# Patient Record
Sex: Male | Born: 1990 | Race: Black or African American | Hispanic: No | Marital: Married | Smoking: Never smoker
Health system: Southern US, Community
[De-identification: ages and names within clinical notes are randomized; demographics above are authoritative.]

## PROBLEM LIST (undated history)

## (undated) HISTORY — PX: NO PAST SURGERIES: SHX2092

---

## 2020-03-31 DIAGNOSIS — I2699 Other pulmonary embolism without acute cor pulmonale: Secondary | ICD-10-CM

## 2020-03-31 HISTORY — DX: Other pulmonary embolism without acute cor pulmonale: I26.99

## 2020-04-12 ENCOUNTER — Inpatient Hospital Stay
Admission: EM | Admit: 2020-04-12 | Discharge: 2020-04-16 | DRG: 177 | Disposition: A | Discharge status: Home / Self Care | Payer: HRSA Program | Attending: Family Medicine | Admitting: Family Medicine

## 2020-04-12 ENCOUNTER — Encounter: Payer: Self-pay | Admitting: *Deleted

## 2020-04-12 ENCOUNTER — Other Ambulatory Visit: Payer: Self-pay

## 2020-04-12 ENCOUNTER — Emergency Department: Payer: HRSA Program

## 2020-04-12 DIAGNOSIS — R0902 Hypoxemia: Secondary | ICD-10-CM

## 2020-04-12 DIAGNOSIS — R0602 Shortness of breath: Secondary | ICD-10-CM | POA: Diagnosis present

## 2020-04-12 DIAGNOSIS — J810 Acute pulmonary edema: Secondary | ICD-10-CM | POA: Diagnosis present

## 2020-04-12 DIAGNOSIS — T380X5A Adverse effect of glucocorticoids and synthetic analogues, initial encounter: Secondary | ICD-10-CM | POA: Diagnosis not present

## 2020-04-12 DIAGNOSIS — U071 COVID-19: Principal | ICD-10-CM | POA: Diagnosis present

## 2020-04-12 DIAGNOSIS — I2699 Other pulmonary embolism without acute cor pulmonale: Secondary | ICD-10-CM | POA: Diagnosis present

## 2020-04-12 DIAGNOSIS — F17210 Nicotine dependence, cigarettes, uncomplicated: Secondary | ICD-10-CM | POA: Diagnosis present

## 2020-04-12 DIAGNOSIS — J1282 Pneumonia due to coronavirus disease 2019: Secondary | ICD-10-CM | POA: Diagnosis present

## 2020-04-12 DIAGNOSIS — R739 Hyperglycemia, unspecified: Secondary | ICD-10-CM | POA: Diagnosis not present

## 2020-04-12 DIAGNOSIS — M79606 Pain in leg, unspecified: Secondary | ICD-10-CM

## 2020-04-12 DIAGNOSIS — J9601 Acute respiratory failure with hypoxia: Secondary | ICD-10-CM | POA: Diagnosis present

## 2020-04-12 LAB — BASIC METABOLIC PANEL
Anion gap: 13 (ref 5–15)
BUN: 18 mg/dL (ref 6–20)
CO2: 20 mmol/L — ABNORMAL LOW (ref 22–32)
Calcium: 8.8 mg/dL — ABNORMAL LOW (ref 8.9–10.3)
Chloride: 103 mmol/L (ref 98–111)
Creatinine, Ser: 1.11 mg/dL (ref 0.61–1.24)
GFR, Estimated: >60 mL/min (ref >60)
Glucose, Bld: 128 mg/dL — ABNORMAL HIGH (ref 70–99)
Potassium: 3.7 mmol/L (ref 3.5–5.1)
Sodium: 136 mmol/L (ref 135–145)

## 2020-04-12 LAB — TROPONIN I (HIGH SENSITIVITY): Troponin I (High Sensitivity): 8 ng/L (ref <18)

## 2020-04-12 LAB — CBC
HCT: 42.8 % (ref 39.0–52.0)
Hemoglobin: 14.7 g/dL (ref 13.0–17.0)
MCH: 28.1 pg (ref 26.0–34.0)
MCHC: 34.3 g/dL (ref 30.0–36.0)
MCV: 81.8 fL (ref 80.0–100.0)
Platelets: 522 10*3/uL — ABNORMAL HIGH (ref 150–400)
RBC: 5.23 MIL/uL (ref 4.22–5.81)
RDW: 12.6 % (ref 11.5–15.5)
WBC: 10.7 10*3/uL — ABNORMAL HIGH (ref 4.0–10.5)
nRBC: 0 % (ref 0.0–0.2)

## 2020-04-12 MED ORDER — HEPARIN (PORCINE) 25000 UT/250ML-% IV SOLN
1550.0000 [IU]/h | INTRAVENOUS | Status: DC
  Administered 2020-04-12: 1550 [IU]/h via INTRAVENOUS
  Filled 2020-04-12: qty 250

## 2020-04-12 MED ORDER — DEXAMETHASONE SODIUM PHOSPHATE 10 MG/ML IJ SOLN
10.0000 mg | Freq: Once | INTRAMUSCULAR | Status: AC
  Administered 2020-04-12: 23:00:00 10 mg via INTRAVENOUS
  Filled 2020-04-12: qty 1

## 2020-04-12 MED ORDER — IOHEXOL 350 MG/ML SOLN
75.0000 mL | Freq: Once | INTRAVENOUS | Status: AC | PRN
  Administered 2020-04-12: 23:00:00 75 mL via INTRAVENOUS

## 2020-04-12 MED ORDER — HEPARIN BOLUS VIA INFUSION
5000.0000 [IU] | Freq: Once | INTRAVENOUS | Status: AC
  Administered 2020-04-12: 5000 [IU] via INTRAVENOUS
  Filled 2020-04-12: qty 5000

## 2020-04-12 MED ORDER — SODIUM CHLORIDE 0.9 % IV SOLN
200.0000 mg | Freq: Once | INTRAVENOUS | Status: AC
  Administered 2020-04-13: 01:00:00 200 mg via INTRAVENOUS
  Filled 2020-04-12: qty 200

## 2020-04-12 MED ORDER — SODIUM CHLORIDE 0.9 % IV SOLN
100.0000 mg | Freq: Every day | INTRAVENOUS | Status: AC
  Administered 2020-04-13 – 2020-04-16 (×4): 100 mg via INTRAVENOUS
  Filled 2020-04-12 (×5): qty 20

## 2020-04-12 NOTE — Progress Notes (Signed)
Remdesivir - Pharmacy Brief Note   O:  CXR: "Mild patchy density at the lung bases, which may reflect atelectasis" or atypical pneumonia in the appropriate setting. SpO2: 89-97% on 2 L/min [place]   A/P:  Remdesivir 200 mg IVPB once followed by 100 mg IVPB daily x 4 days.   Otelia Sergeant, PharmD, [facility] 04/12/2020 10:45 PM

## 2020-04-12 NOTE — H&P (Signed)
History and Physical    Chad Cannon HYW:[id number] DOB: 09-Nov-1990 DOA: 04/12/2020  PCP: Patient, No Pcp Per  Patient coming from: Home  I have personally briefly reviewed patient's old medical records in [facility]  Chief Complaint: Shortness of breath, positive Covid test  HPI: Chad Cannon is a 29 y.o. male without significant medical history who presents to the ED for evaluation of worsening shortness of breath and pleuritic chest pain.  Patient states about 2 weeks ago he began feeling generally unwell with fatigue and cough.  He had a positive COVID-19 test on 03/31/2020.  Since then he has been having progressive symptoms of fevers, chills, diaphoresis, nonproductive cough, body aches/myalgias.  He had some shortness of breath intermittently however yesterday developed severe shortness of breath occurring even at rest.  He began to have right-sided pleuritic chest pain with inspiration.  He has felt as if his heart has been racing.  He denies any swelling in his legs or calf pain.  Symptoms became so severe he came to the ED for further evaluation.  Patient denies any hemoptysis or any other obvious bleeding.  He is not currently taking any medications.  He denies any personal medical conditions and is unaware of any medical conditions in his immediate family.  He has not received any of the COVID-19 vaccinations.  ED Course:  Initial vitals showed BP 148/85, pulse 170, RR 35, temp 99.0 F, SPO2 89% on room air.  Patient was placed on 4 L supplemental O2 via [place] with SPO2 96%.  Labs show sodium 136, potassium 3.7, bicarb 20, BUN 18, creatinine 1.11, serum glucose 120, WBC 10.7, hemoglobin 14.7, platelets 522,000, high-sensitivity troponin I 8.  Portable chest x-ray showed mild patchy density at the lung bases.  CTA chest PE study showed acute pulmonary embolism without CT evidence of right heart strain.  Developing pulmonary infarcts within the right upper and right  lower lobes are seen.  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  EDP discussed with on-call vascular surgery, Chad Cannon, who recommended starting IV heparin and they will consult in a.m. to discuss potential thrombectomy.  Patient was also given IV Decadron 10 mg and started on IV remdesivir.  The hospitalist service was consulted to admit for further evaluation and management.  Review of Systems: All systems reviewed and are negative except as documented in history of present illness above.   History reviewed. No pertinent past medical history.  History reviewed. No pertinent surgical history.  Social History:  reports that he has been smoking cigarettes. He has never used smokeless tobacco. He reports current alcohol use. He reports that he does not use drugs.  No Known Allergies  History reviewed. No pertinent family history.   Prior to Admission medications   Not on File    Physical Exam: Vitals:   04/12/20 2203 04/12/20 2206 04/12/20 2230 04/12/20 2336  BP: 130/89  121/85 125/77  Pulse: (!) 150  (!) 133 (!) 134  Resp: (!) 41  (!) 36 (!) 26  Temp:    98.5 F (36.9 C)  TempSrc:      SpO2: 93% 95% 97% 96%  Weight:      Height:       Constitutional: Resting in bed with head elevated, NAD, calm, comfortable Eyes: PERRL, lids and conjunctivae normal ENMT: Mucous membranes are moist. Posterior pharynx clear of any exudate or lesions.Normal dentition.  Neck: normal, supple, no masses. Respiratory: Inspiratory crackles right lung  field.  Increased respiratory effort. No accessory muscle use.  Cardiovascular: Tachycardic with regular rhythm, no murmurs / rubs / gallops. No extremity edema. 2+ pedal pulses. Abdomen: no tenderness, no masses palpated. No hepatosplenomegaly. Bowel sounds positive.  Musculoskeletal: no clubbing / cyanosis. No joint deformity upper and lower extremities. Good ROM, no contractures. Normal muscle  tone.  Skin: no rashes, lesions, ulcers. No induration Neurologic: CN 2-12 grossly intact. Sensation intact, Strength 5/5 in all 4.  Psychiatric: Normal judgment and insight. Alert and oriented x 3. Normal mood.    Labs on Admission: I have personally reviewed following labs and imaging studies  CBC: Recent Labs  Lab 04/12/20 2158  WBC 10.7*  HGB 14.7  HCT 42.8  MCV 81.8  PLT 522*   Basic Metabolic Panel: Recent Labs  Lab 04/12/20 2158  NA 136  K 3.7  CL 103  CO2 20*  GLUCOSE 128*  BUN 18  CREATININE 1.11  CALCIUM 8.8*   GFR: Estimated Creatinine Clearance: 112.4 mL/min (by C-G formula based on SCr of 1.11 mg/dL). Liver Function Tests: No results for input(s): AST, ALT, ALKPHOS, BILITOT, PROT, ALBUMIN in the last 168 hours. No results for input(s): LIPASE, AMYLASE in the last 168 hours. No results for input(s): AMMONIA in the last 168 hours. Coagulation Profile: No results for input(s): INR, PROTIME in the last 168 hours. Cardiac Enzymes: No results for input(s): CKTOTAL, CKMB, CKMBINDEX, TROPONINI in the last 168 hours. BNP (last 3 results) No results for input(s): PROBNP in the last 8760 hours. HbA1C: No results for input(s): HGBA1C in the last 72 hours. CBG: No results for input(s): GLUCAP in the last 168 hours. Lipid Profile: No results for input(s): CHOL, HDL, LDLCALC, TRIG, CHOLHDL, LDLDIRECT in the last 72 hours. Thyroid Function Tests: No results for input(s): TSH, T4TOTAL, FREET4, T3FREE, THYROIDAB in the last 72 hours. Anemia Panel: No results for input(s): VITAMINB12, FOLATE, FERRITIN, TIBC, IRON, RETICCTPCT in the last 72 hours. Urine analysis: No results found for: COLORURINE, APPEARANCEUR, LABSPEC, PHURINE, GLUCOSEU, HGBUR, BILIRUBINUR, KETONESUR, PROTEINUR, UROBILINOGEN, NITRITE, LEUKOCYTESUR  Radiological Exams on Admission: CT Angio Chest PE W and/or Wo Contrast  Result Date: 04/12/2020 CLINICAL DATA:  Right-sided chest pain, COVID  pneumonia, progressive dyspnea EXAM: CT ANGIOGRAPHY CHEST WITH CONTRAST TECHNIQUE: Multidetector CT imaging of the chest was performed using the standard protocol during bolus administration of intravenous contrast. Multiplanar CT image reconstructions and MIPs were obtained to evaluate the vascular anatomy. CONTRAST:  62mL OMNIPAQUE IOHEXOL 350 MG/ML SOLN COMPARISON:  None. FINDINGS: Cardiovascular: The pulmonary arterial tree is suboptimally opacified, likely related to respiratory artifact. However, despite this, there is intraluminal filling defect identified within the right upper and lower lobar pulmonary arteries in keeping with acute pulmonary embolism. The central pulmonary arteries are not enlarged. No significant coronary artery calcification. No CT evidence of right heart strain. Global cardiac size within normal limits. No pericardial effusion. Thoracic aorta is unremarkable. Mediastinum/Nodes: No enlarged mediastinal, hilar, or axillary lymph nodes. Thyroid gland, trachea, and esophagus demonstrate no significant findings. 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 There is, however, superimposed consolidation and ground-glass infiltrate within the basilar right lower lobe and posterior segment of the right upper lobe peripherally most in keeping with acute pulmonary infarcts. Tiny right pleural effusion. No pneumothorax. No central obstructing lesion. Upper Abdomen: No acute abnormality. Musculoskeletal: No chest wall abnormality. No acute or significant  osseous findings. Review of the MIP images confirms the above findings. IMPRESSION: Acute pulmonary embolism. Suboptimal imaging limits evaluation of the embolic burden, however, there is no CT evidence of right heart strain. Developing pulmonary infarcts within the right upper lobe and  right lower lobe. 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 Mild parenchymal involvement in this respect. Attempts are being made at this time to contact the ordering physician for direct communication. Electronically Signed   By: Helyn Numbers MD   On: 04/12/2020 23:25   DG Chest Portable 1 View  Result Date: 04/12/2020 CLINICAL DATA:  Shortness of breath, positive COVID test 12/2 EXAM: PORTABLE CHEST 1 VIEW COMPARISON:  None. FINDINGS: Low lung volumes secondary to poor inspiration. Mild patchy density at the lung bases. No pleural effusion or pneumothorax. Cardiomediastinal contours are within normal limits. IMPRESSION: Mild patchy density at the lung bases, which may reflect atelectasis or atypical pneumonia in the appropriate setting. Electronically Signed   By: Guadlupe Spanish M.D.   On: 04/12/2020 21:52    EKG: Personally reviewed. Sinus tachycardia, rate 168.  No prior for comparison.  Assessment/Plan Principal Problem:   Acute hypoxemic respiratory failure due to COVID-19 [facility]) Active Problems:   Acute pulmonary embolism without acute cor pulmonale (HCC)   Acute pulmonary embolism (HCC)  Kaven Culton is a 29 y.o. male without significant medical history who is admitted with acute respiratory failure with hypoxia due to acute PE and COVID-19.  Acute respiratory failure with hypoxia due to acute PE: Presenting symptoms largely due to pulmonary embolism and associated pulmonary infarcts of the right upper and lower lobes.  Likely provoked from COVID-19 infection.  -Continue IV heparin -Continue supplemental O2 to keep SPO2 >90% -Obtain echocardiogram -Pain control with scheduled Tylenol, as needed OxyIR and IV morphine with hold parameters -EDP discussed with on-call vascular surgery, Chad Cannon, who will consult in a.m. to discuss potential thrombectomy  COVID-19 pneumonitis: SARS-CoV-2 + 03/31/2020.  Infiltrates seen on CTA  chest.  Currently requiring 2-4 L supplemental O2 via [place]. -Continue IV remdesivir -Continue Decadron -Continue supplemental O2 and wean as able -Incentive spirometer, flutter valve, albuterol as needed  DVT prophylaxis: IV heparin Code Status: Full code, confirmed with patient Family Communication: Discussed with patient, he has discussed with family Disposition Plan: From home and likely discharge to home pending further management of acute PE and COVID-19, symptomatic/respiratory improvement, and vascular surgery consultation/recommendations Consults called: Vascular surgery Admission status:  Status is: Inpatient  Remains inpatient appropriate because:IV treatments appropriate due to intensity of illness or inability to take PO and Inpatient level of care appropriate due to severity of illness   Dispo: The patient is from: Home              Anticipated d/c is to: Home              Anticipated d/c date is: 3 days              Patient currently is not medically stable to d/c.   Darreld Mclean MD [facility]  If 7PM-7AM, please contact night-coverage [email or web address]  04/13/2020, 12:21 AM

## 2020-04-12 NOTE — ED Triage Notes (Signed)
Pt to ED reporting a positive COVID test on 12/2 with increased SOB and right sided chest and back pain over the past week. Pt is diaphoretic and tachypnic reporting it is painful to take a deep breath. No fevers per pt. No cardiac or pulmonary hx. No NVD and per pt he has had a decreased PO intake.

## 2020-04-12 NOTE — Progress Notes (Addendum)
ANTICOAGULATION CONSULT NOTE  Pharmacy Consult for heparin infusion Indication: pulmonary embolus  No Known Allergies  Patient Measurements: Height: 5\' 9"  (175.3 cm) Weight: 96.2 kg (212 lb) IBW/kg (Calculated) : 70.7 Heparin Dosing Weight: 90.7kg  Vital Signs: Temp: 98.5 F (36.9 C) (12/13 2336) Temp Source: Oral (12/13 2149) BP: 125/77 (12/13 2336) Pulse Rate: 134 (12/13 2336)  Labs: Recent Labs    04/12/20 2158  HGB 14.7  HCT 42.8  PLT 522*  CREATININE 1.11  TROPONINIHS 8    Estimated Creatinine Clearance: 112.4 mL/min (by C-G formula based on SCr of 1.11 mg/dL).   Goal of Therapy:  Heparin level 0.3-0.7 units/ml Monitor platelets by anticoagulation protocol: Yes   Plan:  Order baseline aPTT and INR Give 5000 units bolus x 1 Start heparin infusion at 1550 units/hr Check anti-Xa level in 6 hours and daily while on heparin Continue to monitor H&H and platelets  2159, PharmD, [facility] 04/12/2020 11:43 PM

## 2020-04-12 NOTE — ED Notes (Signed)
Pt placed on 2 L of oxygen via [place]

## 2020-04-12 NOTE — ED Provider Notes (Addendum)
[facility] Emergency Department Provider Note   ____________________________________________   I have reviewed the triage vital signs and the nursing notes.   HISTORY  Chief Complaint Chest Pain and Shortness of Breath   History limited by: Not Limited   HPI Chad Cannon is a 29 y.o. male who presents to the emergency department today because of concerns for chest pain and shortness of breath.  Patient was diagnosed with Covid roughly 10 days ago.  He states that since his diagnosis he is been developing worsening of right sided chest pain.  He describes it as sharp.  It is worse with deep breaths or cough.  Patient has had associated shortness of breath with this as well.  He denies any previous lung disease.   Records reviewed. Per medical record review patient has a history of positive covid test on 03/31/2020  History reviewed. No pertinent past medical history.  There are no problems to display for this patient.   History reviewed. No pertinent surgical history.  Prior to Admission medications   Not on File    Allergies Patient has no known allergies.  History reviewed. No pertinent family history.  Social History Social History   Tobacco Use  . Smoking status: Current Every Day Smoker    Types: Cigarettes  . Smokeless tobacco: Never Used  Substance Use Topics  . Alcohol use: Yes    Comment: socially  . Drug use: Never    Review of Systems Constitutional: No fever/chills Eyes: No visual changes. ENT: No sore throat. Cardiovascular: Positive for chest pain. Respiratory: Positive for  shortness of breath. Gastrointestinal: No abdominal pain.  No nausea, no vomiting.  No diarrhea.   Genitourinary: Negative for dysuria. Musculoskeletal: Negative for back pain. Skin: Negative for rash. Neurological: Negative for headaches, focal weakness or numbness.  ____________________________________________   PHYSICAL EXAM:  VITAL  SIGNS: ED Triage Vitals  Enc Vitals Group     BP 04/12/20 2149 (!) 148/85     Pulse Rate 04/12/20 2149 (!) 170     Resp 04/12/20 2149 (!) 35     Temp 04/12/20 2149 99 F (37.2 C)     Temp Source 04/12/20 2149 Oral     SpO2 04/12/20 2149 (!) 89 %     Weight 04/12/20 2150 212 lb (96.2 kg)     Height 04/12/20 2150 5\' 9"  (1.753 m)     Head Circumference --      Peak Flow --      Pain Score 04/12/20 2150 9    Constitutional: Alert and oriented.  Eyes: Conjunctivae are normal.  ENT      Head: Normocephalic and atraumatic.      Nose: No congestion/rhinnorhea.      Mouth/Throat: Mucous membranes are moist.      Neck: No stridor. Hematological/Lymphatic/Immunilogical: No cervical lymphadenopathy. Cardiovascular: Tachycardic, regular rhythm.  No murmurs, rubs, or gallops.  Respiratory: Tachypnea. Lung sounds clear. Gastrointestinal: Soft and non tender. No rebound. No guarding.  Genitourinary: Deferred Musculoskeletal: Normal range of motion in all extremities. No lower extremity edema. Neurologic:  Normal speech and language. No gross focal neurologic deficits are appreciated.  Skin:  Skin is warm, dry and intact. No rash noted. Psychiatric: Mood and affect are normal. Speech and behavior are normal. Patient exhibits appropriate insight and judgment.  ____________________________________________    LABS (pertinent positives/negatives)  Trop hs 8 CBC wbc 10.7, hgb 14.7, plt 522 BMP na 136, k 3.7, glu 128, cr 1.11  ____________________________________________  EKG  I, Phineas Semen, attending physician, personally viewed and interpreted this EKG  EKG Time: 2140 Rate: 168 Rhythm: sinus tachycardia Axis: normal Intervals: qtc 478 QRS: narrow ST changes: no st elevation Impression: abnormal ekg  ____________________________________________    RADIOLOGY  CXR Mild patchy densities at lung bases. No PTX.  CT angio Positive PE with pulmonary infarct. No sign of  right heart strain. ____________________________________________   PROCEDURES  Procedures  CRITICAL CARE Performed by: Phineas Semen   Total critical care time: 35 minutes  Critical care time was exclusive of separately billable procedures and treating other patients.  Critical care was necessary to treat or prevent imminent or life-threatening deterioration.  Critical care was time spent personally by me on the following activities: development of treatment plan with patient and/or surrogate as well as nursing, discussions with consultants, evaluation of patient's response to treatment, examination of patient, obtaining history from patient or surrogate, ordering and performing treatments and interventions, ordering and review of laboratory studies, ordering and review of radiographic studies, pulse oximetry and re-evaluation of patient's condition.  ____________________________________________   INITIAL IMPRESSION / ASSESSMENT AND PLAN / ED COURSE  Pertinent labs & imaging results that were available during my care of the patient were reviewed by me and considered in my medical decision making (see chart for details).   Patient presented to the emergency department today because of concerns for right-sided chest pain and shortness of breath.  Patient was diagnosed with Covid 12 days ago.  On exam patient was noted to be quite tachypneic and tachycardic.  Chest x-ray did not show pneumothorax.  Did have concern for possible pulmonary embolism.  Patient was also found to be hypoxic.  Will get CT angio to evaluate for PE.  Patient will require hospitalization given hypoxia even with negative PE study.  Patient's CT angio did come back positive for PEs and pulmonary infarct.  I discussed with Dr. Wyn Quaker with vascular surgery.  At this time will place patient on heparin.  Dr. Wyn Quaker will evaluate in the morning.  Discussed finding with patient.  Discussed with hospitalist for  admission.  ____________________________________________   FINAL CLINICAL IMPRESSION(S) / ED DIAGNOSES  Final diagnoses:  COVID-19  Hypoxia  Acute pulmonary edema (HCC)     Note: This dictation was prepared with Dragon dictation. Any transcriptional errors that result from this process are unintentional     Phineas Semen, MD 04/12/20 5625    Phineas Semen, MD 04/12/20 [phone]

## 2020-04-13 ENCOUNTER — Inpatient Hospital Stay (HOSPITAL_COMMUNITY)
Admit: 2020-04-13 | Discharge: 2020-04-13 | Disposition: A | Discharge status: Home / Self Care | Payer: HRSA Program | Attending: Internal Medicine | Admitting: Internal Medicine

## 2020-04-13 ENCOUNTER — Inpatient Hospital Stay: Payer: HRSA Program

## 2020-04-13 ENCOUNTER — Encounter: Payer: Self-pay | Admitting: Internal Medicine

## 2020-04-13 DIAGNOSIS — I2609 Other pulmonary embolism with acute cor pulmonale: Secondary | ICD-10-CM

## 2020-04-13 LAB — CBC WITH DIFFERENTIAL/PLATELET
Abs Immature Granulocytes: 0.07 K/uL (ref 0.00–0.07)
Basophils Absolute: 0 K/uL (ref 0.0–0.1)
Basophils Relative: 0 %
Eosinophils Absolute: 0 K/uL (ref 0.0–0.5)
Eosinophils Relative: 0 %
HCT: 40.7 % (ref 39.0–52.0)
Hemoglobin: 13.6 g/dL (ref 13.0–17.0)
Immature Granulocytes: 1 %
Lymphocytes Relative: 9 %
Lymphs Abs: 1 K/uL (ref 0.7–4.0)
MCH: 27.6 pg (ref 26.0–34.0)
MCHC: 33.4 g/dL (ref 30.0–36.0)
MCV: 82.7 fL (ref 80.0–100.0)
Monocytes Absolute: 0.6 K/uL (ref 0.1–1.0)
Monocytes Relative: 5 %
Neutro Abs: 10 K/uL — ABNORMAL HIGH (ref 1.7–7.7)
Neutrophils Relative %: 85 %
Platelets: 472 K/uL — ABNORMAL HIGH (ref 150–400)
RBC: 4.92 MIL/uL (ref 4.22–5.81)
RDW: 12.6 % (ref 11.5–15.5)
WBC: 11.8 K/uL — ABNORMAL HIGH (ref 4.0–10.5)
nRBC: 0 % (ref 0.0–0.2)

## 2020-04-13 LAB — HEMOGLOBIN A1C
Hgb A1c MFr Bld: 5.8 % — ABNORMAL HIGH (ref 4.8–5.6)
Mean Plasma Glucose: 119.76 mg/dL

## 2020-04-13 LAB — HIV ANTIBODY (ROUTINE TESTING W REFLEX): HIV Screen 4th Generation wRfx: NONREACTIVE

## 2020-04-13 LAB — ECHOCARDIOGRAM COMPLETE
AR max vel: 3.79 cm2
AV Area VTI: 4.52 cm2
AV Area mean vel: 3.95 cm2
AV Mean grad: 1 mmHg
AV Peak grad: 2.4 mmHg
Ao pk vel: 0.77 m/s
Area-P 1/2: 7.16 cm2
Height: 69 in
S' Lateral: 3.11 cm
Weight: 3392 [oz_av]

## 2020-04-13 LAB — COMPREHENSIVE METABOLIC PANEL WITH GFR
ALT: 51 U/L — ABNORMAL HIGH (ref 0–44)
AST: 24 U/L (ref 15–41)
Albumin: 3.4 g/dL — ABNORMAL LOW (ref 3.5–5.0)
Alkaline Phosphatase: 49 U/L (ref 38–126)
Anion gap: 10 (ref 5–15)
BUN: 17 mg/dL (ref 6–20)
CO2: 22 mmol/L (ref 22–32)
Calcium: 8.9 mg/dL (ref 8.9–10.3)
Chloride: 103 mmol/L (ref 98–111)
Creatinine, Ser: 0.98 mg/dL (ref 0.61–1.24)
GFR, Estimated: >60 mL/min
Glucose, Bld: 129 mg/dL — ABNORMAL HIGH (ref 70–99)
Potassium: 4.5 mmol/L (ref 3.5–5.1)
Sodium: 135 mmol/L (ref 135–145)
Total Bilirubin: 0.7 mg/dL (ref 0.3–1.2)
Total Protein: 7.7 g/dL (ref 6.5–8.1)

## 2020-04-13 LAB — RESP PANEL BY RT-PCR (FLU A&B, COVID) ARPGX2
Influenza A by PCR: NEGATIVE
Influenza B by PCR: NEGATIVE
SARS Coronavirus 2 by RT PCR: POSITIVE — AB

## 2020-04-13 LAB — TROPONIN I (HIGH SENSITIVITY): Troponin I (High Sensitivity): 5 ng/L (ref <18)

## 2020-04-13 LAB — PROTIME-INR
INR: 1.3 — ABNORMAL HIGH (ref 0.8–1.2)
Prothrombin Time: 15.3 s — ABNORMAL HIGH (ref 11.4–15.2)

## 2020-04-13 LAB — HEPARIN LEVEL (UNFRACTIONATED)
Heparin Unfractionated: 0.91 IU/mL — ABNORMAL HIGH (ref 0.30–0.70)
Heparin Unfractionated: 0.5 IU/mL (ref 0.30–0.70)
Heparin Unfractionated: 0.52 IU/mL (ref 0.30–0.70)

## 2020-04-13 LAB — CBG MONITORING, ED: Glucose-Capillary: 144 mg/dL — ABNORMAL HIGH (ref 70–99)

## 2020-04-13 LAB — APTT: aPTT: 106 s — ABNORMAL HIGH (ref 24–36)

## 2020-04-13 MED ORDER — IPRATROPIUM-ALBUTEROL 20-100 MCG/ACT IN AERS
1.0000 | INHALATION_SPRAY | Freq: Four times a day (QID) | RESPIRATORY_TRACT | Status: DC
  Administered 2020-04-13 – 2020-04-14 (×3): 1 via RESPIRATORY_TRACT
  Filled 2020-04-13: qty 4

## 2020-04-13 MED ORDER — OXYCODONE HCL 5 MG PO TABS: 5.0000 mg | ORAL_TABLET | ORAL | Status: DC | PRN

## 2020-04-13 MED ORDER — BARICITINIB 2 MG PO TABS
4.0000 mg | ORAL_TABLET | Freq: Every day | ORAL | Status: DC
  Administered 2020-04-13 – 2020-04-16 (×4): 4 mg via ORAL
  Filled 2020-04-13 (×4): qty 2

## 2020-04-13 MED ORDER — HEPARIN (PORCINE) 25000 UT/250ML-% IV SOLN
1450.0000 [IU]/h | INTRAVENOUS | Status: DC
  Administered 2020-04-13 – 2020-04-14 (×2): 1350 [IU]/h via INTRAVENOUS
  Filled 2020-04-13 (×3): qty 250

## 2020-04-13 MED ORDER — METHYLPREDNISOLONE SODIUM SUCC 125 MG IJ SOLR
1.0000 mg/kg | Freq: Two times a day (BID) | INTRAMUSCULAR | Status: AC
  Administered 2020-04-13 – 2020-04-15 (×6): 96.25 mg via INTRAVENOUS
  Filled 2020-04-13 (×5): qty 2

## 2020-04-13 MED ORDER — GUAIFENESIN-DM 100-10 MG/5ML PO SYRP: 10.0000 mL | ORAL_SOLUTION | ORAL | Status: DC | PRN

## 2020-04-13 MED ORDER — ADULT MULTIVITAMIN W/MINERALS CH
1.0000 | ORAL_TABLET | Freq: Every day | ORAL | Status: DC
  Administered 2020-04-13 – 2020-04-16 (×4): 1 via ORAL
  Filled 2020-04-13 (×3): qty 1

## 2020-04-13 MED ORDER — INSULIN ASPART 100 UNIT/ML ~~LOC~~ SOLN
0.0000 [IU] | Freq: Three times a day (TID) | SUBCUTANEOUS | Status: DC
  Administered 2020-04-14 – 2020-04-15 (×2): 1 [IU] via SUBCUTANEOUS
  Filled 2020-04-13 (×2): qty 1

## 2020-04-13 MED ORDER — VITAMIN D 25 MCG (1000 UNIT) PO TABS
1000.0000 [IU] | ORAL_TABLET | Freq: Every day | ORAL | Status: DC
  Administered 2020-04-13 – 2020-04-16 (×4): 1000 [IU] via ORAL
  Filled 2020-04-13 (×4): qty 1

## 2020-04-13 MED ORDER — DEXAMETHASONE SODIUM PHOSPHATE 10 MG/ML IJ SOLN
6.0000 mg | INTRAMUSCULAR | Status: DC
  Administered 2020-04-13: 07:00:00 6 mg via INTRAVENOUS
  Filled 2020-04-13: qty 1

## 2020-04-13 MED ORDER — ALBUTEROL SULFATE HFA 108 (90 BASE) MCG/ACT IN AERS
2.0000 | INHALATION_SPRAY | RESPIRATORY_TRACT | Status: DC | PRN
  Filled 2020-04-13: qty 6.7

## 2020-04-13 MED ORDER — PREDNISONE 50 MG PO TABS: 50.0000 mg | ORAL_TABLET | Freq: Every day | ORAL | Status: DC

## 2020-04-13 MED ORDER — ACETAMINOPHEN 325 MG PO TABS
650.0000 mg | ORAL_TABLET | Freq: Four times a day (QID) | ORAL | Status: DC
  Administered 2020-04-13 – 2020-04-16 (×12): 650 mg via ORAL
  Filled 2020-04-13 (×11): qty 2

## 2020-04-13 MED ORDER — MORPHINE SULFATE (PF) 2 MG/ML IV SOLN: 1.0000 mg | INTRAVENOUS | Status: DC | PRN

## 2020-04-13 MED ORDER — ASCORBIC ACID 500 MG PO TABS
500.0000 mg | ORAL_TABLET | Freq: Every day | ORAL | Status: DC
  Administered 2020-04-13 – 2020-04-16 (×4): 500 mg via ORAL
  Filled 2020-04-13 (×4): qty 1

## 2020-04-13 MED ORDER — SODIUM CHLORIDE 0.9% FLUSH
3.0000 mL | Freq: Two times a day (BID) | INTRAVENOUS | Status: DC
  Administered 2020-04-13 – 2020-04-16 (×8): 3 mL via INTRAVENOUS

## 2020-04-13 MED ORDER — ONDANSETRON HCL 4 MG PO TABS: 4.0000 mg | ORAL_TABLET | Freq: Four times a day (QID) | ORAL | Status: DC | PRN

## 2020-04-13 MED ORDER — ZINC SULFATE 220 (50 ZN) MG PO CAPS
220.0000 mg | ORAL_CAPSULE | Freq: Every day | ORAL | Status: DC
  Administered 2020-04-13 – 2020-04-16 (×4): 220 mg via ORAL
  Filled 2020-04-13 (×4): qty 1

## 2020-04-13 MED ORDER — ONDANSETRON HCL 4 MG/2ML IJ SOLN: 4.0000 mg | Freq: Four times a day (QID) | INTRAMUSCULAR | Status: DC | PRN

## 2020-04-13 MED ORDER — HYDROCOD POLST-CPM POLST ER 10-8 MG/5ML PO SUER
5.0000 mL | Freq: Two times a day (BID) | ORAL | Status: DC | PRN
  Administered 2020-04-14: 5 mL via ORAL
  Filled 2020-04-13: qty 5

## 2020-04-13 NOTE — Progress Notes (Signed)
ANTICOAGULATION CONSULT NOTE  Pharmacy Consult for heparin infusion Indication: pulmonary embolus  No Known Allergies  Patient Measurements: Height: 5\' 9"  (175.3 cm) Weight: 96.2 kg (212 lb) IBW/kg (Calculated) : 70.7 Heparin Dosing Weight: 90.7kg  Vital Signs: Temp: 98.4 F (36.9 C) (12/14 0600) Temp Source: Oral (12/14 0600) BP: 106/73 (12/14 1230) Pulse Rate: 104 (12/14 1230)  Labs: Recent Labs    04/12/20 2158 04/12/20 2335 04/13/20 0605 04/13/20 1240  HGB 14.7  --  13.6  --   HCT 42.8  --  40.7  --   PLT 522*  --  472*  --   APTT  --   --  106*  --   LABPROT  --   --  15.3*  --   INR  --   --  1.3*  --   HEPARINUNFRC  --   --  0.91* 0.50  CREATININE 1.11  --  0.98  --   TROPONINIHS 8 5  --   --     Estimated Creatinine Clearance: 127.3 mL/min (by C-G formula based on SCr of 0.98 mg/dL).   Goal of Therapy:  Heparin level 0.3-0.7 units/ml Monitor platelets by anticoagulation protocol: Yes   Date Time Level Rate 12/14 0605 0.91 1550 units/hr - supratherapeutic 12/14 1240 0.50 1350 units/hr - therapeutic x1  Plan:  Continue with heparin infusion at 1350 units/hr Therapeutic x1; Recheck anti-Xa level in 6 hours. If therapeutic; then daily thereafter while on heparin  Continue to monitor H&H and platelets   1/15, [facility] 04/13/2020 1:36 PM

## 2020-04-13 NOTE — Progress Notes (Signed)
PROGRESS NOTE  Chad Cannon JJO:[id number] DOB: Aug 30, 1990 DOA: 04/12/2020 PCP: Patient, No Pcp Per  HPI/Recap of past 24 hours: Chad Cannon is a 29 y.o. male without significant medical history who presents to [facility] ED for evaluation of worsening shortness of breath and pleuritic chest pain.  Patient states about 2 weeks ago he began feeling generally unwell with fatigue and cough.  He had a positive COVID-19 test on 03/31/2020.  Since then he has been having progressive symptoms of fevers, chills, diaphoresis, nonproductive cough, body aches/myalgias.  He had some shortness of breath intermittently however yesterday developed severe shortness of breath occurring even at rest.  He began to have right-sided pleuritic chest pain with inspiration.  He has felt as if his heart has been racing.  He denies any swelling in his legs or calf pain.  Symptoms became so severe he came to the ED for further evaluation.  He has not received any of the COVID-19 vaccinations.  ED Course:  Initial vitals showed BP 148/85, pulse 170, RR 35, temp 99.0 F, SPO2 89% on room air.  Patient was placed on 4 L supplemental O2 via [place] with SPO2 96%.  Portable chest x-ray showed mild patchy density at the lung bases.  CTA chest PE study showed acute pulmonary embolism without CT evidence of right heart strain.  Developing pulmonary infarcts within the right upper and right lower lobes are seen.  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  EDP discussed with on-call vascular surgery, Dr. Wyn Quaker, who recommended starting IV heparin and they will consult in a.m. to discuss potential thrombectomy.  Patient was also given IV Decadron 10 mg and started on IV remdesivir.  The hospitalist service was consulted to admit for further evaluation and management.  04/13/20:  Seen and examined.  He reports pain in his R side.  Worse when he takes a deep breath.   Denies hemoptysis.    Assessment/Plan: Principal Problem:   Acute hypoxemic respiratory failure due to COVID-19 [facility]) Active Problems:   Acute pulmonary embolism without acute cor pulmonale (HCC)  Acute hypoxic respiratory failure 2/2 to covid-19 viral pneumonia, POA and acute pulmonary embolism, POA Not on O2 supplementation at baseline Currently requires 2 L to maintain O2 saturation greater 92% Independent reviewed chest x-ray done on admission which showed bilateral pulmonary infiltrates consistent with COVID-19 viral pneumonia Started on remdesivir on admission, day 2 out of 5 Added baricitinib with patient's consent Added IV Solu-Medrol, bronchodilators Continue vitamin C, D3, zinc, antitussives Pronate as tolerated Incentive spirometer, flutter valve Maintain O2 saturation greater than 92% Wean oxygen as tolerated  Acute pulmonary embolism in the setting of COVID-19 viral infection Presented with dyspnea, hypoxia and pleuritic chest pain Negative troponin Elevated inflammatory markers CTA chest positive for acute pulmonary embolism, no CT evidence of right heart strain, developing pulmonary infarcts within the right upper lobe and right lower lobe. Was started on heparin drip, vascular surgery was consulted Per Admitting provider: EDP discussed with on-call vascular surgery, Dr. Wyn Quaker, who recommended starting IV heparin and they will consult in a.m. to discuss potential thrombectomy. 2D echo is pending, follow results Switch to Eliquis when okay with vascular surgery.  COVID-19 viral pneumonia, POA COVID-19 screening test positive on 03/31/2020 Management as per above Continue to trend inflammatory markers  Elevated ALT likely secondary to COVID-19 viral infection Continue to monitor LFTs Avoid hepatotoxic agents  Hyperglycemia secondary to high doses of IV steroids No prior history of  diabetes Obtain hemoglobin A1c Continue insulin coverage      Code Status:  Full code  Family Communication: None at bedside  Disposition Plan: Likely will DC to home when hemodynamically stable   Consultants:  Vascular surgery  Procedures:  None  Antimicrobials:    DVT prophylaxis: Heparin drip  Status is: Inpatient    Dispo:  Patient From: Home  Planned Disposition: Home  Expected discharge date: 04/16/2020  Medically stable for discharge: No, ongoing management of acute hypoxic respiratory failure secondary to COVID-19 viral pneumonia, acute pulmonary embolism.         Objective: Vitals:   04/13/20 0945 04/13/20 1000 04/13/20 1100 04/13/20 1107  BP:  123/90 129/74   Pulse: 82 97 96 100  Resp: 20 17 (!) 30 (!) 21  Temp:      TempSrc:      SpO2: 94% 93% 94% 92%  Weight:      Height:        Intake/Output Summary (Last 24 hours) at 04/13/2020 1155 Last data filed at 04/13/2020 1115 Gross per 24 hour  Intake 300 ml  Output --  Net 300 ml   Filed Weights   04/12/20 2150  Weight: 96.2 kg    Exam:  . General: 29 y.o. year-old male well developed well nourished in no acute distress.  Alert and oriented x3. . Cardiovascular: Regular rate and rhythm with no rubs or gallops.  No thyromegaly or JVD noted.   Marland Kitchen Respiratory: Mild rales at bases.  No wheezing noted.  Poor inspiratory effort.   . Abdomen: Soft nontender nondistended with normal bowel sounds x4 quadrants. . Musculoskeletal: No lower extremity edema. 2/4 pulses in all 4 extremities. . Skin: No ulcerative lesions noted or rashes . Psychiatry: Mood is appropriate for condition and setting   Data Reviewed: CBC: Recent Labs  Lab 04/12/20 2158 04/13/20 0605  WBC 10.7* 11.8*  NEUTROABS  --  10.0*  HGB 14.7 13.6  HCT 42.8 40.7  MCV 81.8 82.7  PLT 522* 472*   Basic Metabolic Panel: Recent Labs  Lab 04/12/20 2158 04/13/20 0605  NA 136 135  K 3.7 4.5  CL 103 103  CO2 20* 22  GLUCOSE 128* 129*  BUN 18 17  CREATININE 1.11 0.98  CALCIUM 8.8* 8.9    GFR: Estimated Creatinine Clearance: 127.3 mL/min (by C-G formula based on SCr of 0.98 mg/dL). Liver Function Tests: Recent Labs  Lab 04/13/20 0605  AST 24  ALT 51*  ALKPHOS 49  BILITOT 0.7  PROT 7.7  ALBUMIN 3.4*   No results for input(s): LIPASE, AMYLASE in the last 168 hours. No results for input(s): AMMONIA in the last 168 hours. Coagulation Profile: Recent Labs  Lab 04/13/20 0605  INR 1.3*   Cardiac Enzymes: No results for input(s): CKTOTAL, CKMB, CKMBINDEX, TROPONINI in the last 168 hours. BNP (last 3 results) No results for input(s): PROBNP in the last 8760 hours. HbA1C: No results for input(s): HGBA1C in the last 72 hours. CBG: No results for input(s): GLUCAP in the last 168 hours. Lipid Profile: No results for input(s): CHOL, HDL, LDLCALC, TRIG, CHOLHDL, LDLDIRECT in the last 72 hours. Thyroid Function Tests: No results for input(s): TSH, T4TOTAL, FREET4, T3FREE, THYROIDAB in the last 72 hours. Anemia Panel: No results for input(s): VITAMINB12, FOLATE, FERRITIN, TIBC, IRON, RETICCTPCT in the last 72 hours. Urine analysis: No results found for: COLORURINE, APPEARANCEUR, LABSPEC, PHURINE, GLUCOSEU, HGBUR, BILIRUBINUR, KETONESUR, PROTEINUR, UROBILINOGEN, NITRITE, LEUKOCYTESUR Sepsis Labs: @LABRCNTIP (procalcitonin:4,lacticidven:4)  )No results found  for this or any previous visit (from the past 240 hour(s)).    Studies: CT Angio Chest PE W and/or Wo Contrast  Result Date: 04/12/2020 CLINICAL DATA:  Right-sided chest pain, COVID pneumonia, progressive dyspnea EXAM: CT ANGIOGRAPHY CHEST WITH CONTRAST TECHNIQUE: Multidetector CT imaging of the chest was performed using the standard protocol during bolus administration of intravenous contrast. Multiplanar CT image reconstructions and MIPs were obtained to evaluate the vascular anatomy. CONTRAST:  74mL OMNIPAQUE IOHEXOL 350 MG/ML SOLN COMPARISON:  None. FINDINGS: Cardiovascular: The pulmonary arterial tree is  suboptimally opacified, likely related to respiratory artifact. However, despite this, there is intraluminal filling defect identified within the right upper and lower lobar pulmonary arteries in keeping with acute pulmonary embolism. The central pulmonary arteries are not enlarged. No significant coronary artery calcification. No CT evidence of right heart strain. Global cardiac size within normal limits. No pericardial effusion. Thoracic aorta is unremarkable. Mediastinum/Nodes: No enlarged mediastinal, hilar, or axillary lymph nodes. Thyroid gland, trachea, and esophagus demonstrate no significant findings. 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 There is, however, superimposed consolidation and ground-glass infiltrate within the basilar right lower lobe and posterior segment of the right upper lobe peripherally most in keeping with acute pulmonary infarcts. Tiny right pleural effusion. No pneumothorax. No central obstructing lesion. Upper Abdomen: No acute abnormality. Musculoskeletal: No chest wall abnormality. No acute or significant osseous findings. Review of the MIP images confirms the above findings. IMPRESSION: Acute pulmonary embolism. Suboptimal imaging limits evaluation of the embolic burden, however, there is no CT evidence of right heart strain. 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 Mild parenchymal involvement in this respect. Attempts are being made at this time to contact the ordering physician for direct communication. Electronically Signed   By: Helyn Numbers MD   On: 04/12/2020 23:25   US Venous Img Lower Bilateral (DVT)  Result Date: 04/13/2020 CLINICAL DATA:  Leg pain, history of prior  pulmonary emboli shows in this 29 year old male EXAM: Bilateral LOWER EXTREMITY VENOUS DOPPLER ULTRASOUND TECHNIQUE: Gray-scale sonography with compression, as well as color and duplex ultrasound, were performed to evaluate the deep venous system(s) from the level of the common femoral vein through the popliteal and proximal calf veins. COMPARISON:  None FINDINGS: VENOUS Normal compressibility of the common femoral, superficial femoral, and popliteal veins, as well as the visualized calf veins. Visualized portions of profunda femoral vein and great saphenous vein unremarkable. No filling defects to suggest DVT on grayscale or color Doppler imaging. Doppler waveforms show normal direction of venous flow, normal respiratory plasticity and response to augmentation. OTHER Flow related artifact in the lower extremity veins bilaterally compatible with rouleaux flow, no sign of thrombus. Limitations: none IMPRESSION: Sonogram of the bilateral lower extremities, negative for deep venous thrombosis. Electronically Signed   By: Donzetta Kohut M.D.   On: 04/13/2020 11:33   DG Chest Portable 1 View  Result Date: 04/12/2020 CLINICAL DATA:  Shortness of breath, positive COVID test 12/2 EXAM: PORTABLE CHEST 1 VIEW COMPARISON:  None. FINDINGS: Low lung volumes secondary to poor inspiration. Mild patchy density at the lung bases. No pleural effusion or pneumothorax. Cardiomediastinal contours are within normal limits. IMPRESSION: Mild patchy density at the lung bases, which may reflect atelectasis or atypical pneumonia in the appropriate setting. Electronically Signed  By: Guadlupe SpanishPraneil  Patel M.D.   On: 04/12/2020 21:52    Scheduled Meds: . acetaminophen  650 mg Oral Q6H  . vitamin C  500 mg Oral Daily  . baricitinib  4 mg Oral Daily  . cholecalciferol  1,000 Units Oral Daily  . insulin aspart  0-9 Units Subcutaneous TID WC  . Ipratropium-Albuterol  1 puff Inhalation Q6H  . methylPREDNISolone (SOLU-MEDROL) injection  1  mg/kg Intravenous Q12H  . multivitamin with minerals  1 tablet Oral Daily  . sodium chloride flush  3 mL Intravenous Q12H  . zinc sulfate  220 mg Oral Daily    Continuous Infusions: . heparin 1,350 Units/hr (04/13/20 0700)  . [START ON 04/16/2020] predniSONE    . remdesivir 100 mg in NS 100 mL Stopped (04/13/20 1115)     LOS: 1 day     Darlin Droparole N Doneshia Hill, MD [facility] Pager [phone]  If 7PM-7AM, please contact night-coverage [email or web address] Password [email or web address] 04/13/2020, 11:55 AM

## 2020-04-13 NOTE — ED Notes (Signed)
Assumed care of pt at this time.

## 2020-04-13 NOTE — Progress Notes (Signed)
ANTICOAGULATION CONSULT NOTE  Pharmacy Consult for heparin infusion Indication: pulmonary embolus  No Known Allergies  Patient Measurements: Height: 5\' 9"  (175.3 cm) Weight: 96.2 kg (212 lb) IBW/kg (Calculated) : 70.7 Heparin Dosing Weight: 90.7kg  Vital Signs: Temp: 98.5 F (36.9 C) (12/14 2023) Temp Source: Oral (12/14 2023) BP: 125/74 (12/14 2023) Pulse Rate: 113 (12/14 2023)  Labs: Recent Labs    04/12/20 2158 04/12/20 2335 04/13/20 0605 04/13/20 1240 04/13/20 1900  HGB 14.7  --  13.6  --   --   HCT 42.8  --  40.7  --   --   PLT 522*  --  472*  --   --   APTT  --   --  106*  --   --   LABPROT  --   --  15.3*  --   --   INR  --   --  1.3*  --   --   HEPARINUNFRC  --   --  0.91* 0.50 0.52  CREATININE 1.11  --  0.98  --   --   TROPONINIHS 8 5  --   --   --     Estimated Creatinine Clearance: 127.3 mL/min (by C-G formula based on SCr of 0.98 mg/dL).   Goal of Therapy:  Heparin level 0.3-0.7 units/ml Monitor platelets by anticoagulation protocol: Yes   Date Time Level Rate 12/14 0605 0.91 1550 units/hr - supratherapeutic 12/14 1240 0.50 1350 units/hr - therapeutic x1 12/14 1900 0.52 1350 units/hr - therapeutic x 2   Plan:  Continue with heparin infusion at 1350 units/hr Therapeutic x2; Recheck anti-Xa level and CBC with AM labs. Continue to monitor H&H and platelets   1/15, [facility] 04/13/2020 9:06 PM

## 2020-04-13 NOTE — ED Notes (Signed)
US at bedside

## 2020-04-13 NOTE — Progress Notes (Signed)
ANTICOAGULATION CONSULT NOTE  Pharmacy Consult for heparin infusion Indication: pulmonary embolus  No Known Allergies  Patient Measurements: Height: 5\' 9"  (175.3 cm) Weight: 96.2 kg (212 lb) IBW/kg (Calculated) : 70.7 Heparin Dosing Weight: 90.7kg  Vital Signs: Temp: 98.4 F (36.9 C) (12/14 0600) Temp Source: Oral (12/14 0600) BP: 118/74 (12/14 0602) Pulse Rate: 114 (12/14 0602)  Labs: Recent Labs    04/12/20 2158 04/12/20 2335 04/13/20 0605  HGB 14.7  --  13.6  HCT 42.8  --  40.7  PLT 522*  --  472*  HEPARINUNFRC  --   --  0.91*  CREATININE 1.11  --  0.98  TROPONINIHS 8 5  --     Estimated Creatinine Clearance: 127.3 mL/min (by C-G formula based on SCr of 0.98 mg/dL).   Goal of Therapy:  Heparin level 0.3-0.7 units/ml Monitor platelets by anticoagulation protocol: Yes   12/14 0605 HL 0.91   Plan:  Decrease heparin infusion to 1350 units/hr Check anti-Xa level in 6 hours and daily while on heparin Continue to monitor H&H and platelets   1/15, PharmD, [facility] 04/13/2020 6:54 AM

## 2020-04-13 NOTE — Progress Notes (Signed)
[facility] Vascular Consult Note  MRN : [id number]  Chad Cannon is a 29 y.o. (10/07/1990) male who presents with chief complaint of  Chief Complaint  Patient presents with  . Chest Pain  . Shortness of Breath   History of Present Illness:  Chad Cannon is a 29 year old male without significant medical history who presents to the ED for evaluation of worsening shortness of breath and pleuritic chest pain.  Patient states about two weeks ago he began feeling generally unwell with fatigue and cough.  He had a positive COVID-19 test on 03/31/2020.  Since then he has been having progressive symptoms of fevers, chills, diaphoresis, nonproductive cough, body aches/myalgias.  He had some shortness of breath intermittently however yesterday developed severe shortness of breath occurring even at rest.  He began to have right-sided pleuritic chest pain with inspiration.  He has felt as if his heart has been racing.  He denies any swelling in his legs or calf pain.  Symptoms became so severe he came to the ED for further evaluation.  Patient notes since the initiation of anticoagulation his breathing and chest discomfort has improved.  Vascular surgery was consulted by Dr. Derrill Kay for possible endovascular intervention.  Current Facility-Administered Medications  Medication Dose Route Frequency Provider Last Rate Last Admin  . acetaminophen (TYLENOL) tablet 650 mg  650 mg Oral Q6H Charlsie Quest, MD   650 mg at 04/13/20 0602  . albuterol (VENTOLIN HFA) 108 (90 Base) MCG/ACT inhaler 2 puff  2 puff Inhalation Q4H PRN Darreld Mclean R, MD      . ascorbic acid (VITAMIN C) tablet 500 mg  500 mg Oral Daily Darreld Mclean R, MD   500 mg at 04/13/20 0953  . baricitinib (OLUMIANT) tablet 4 mg  4 mg Oral Daily Hall, Carole N, DO      . chlorpheniramine-HYDROcodone (TUSSIONEX) 10-8 MG/5ML suspension 5 mL  5 mL Oral Q12H PRN Charlsie Quest, MD      . cholecalciferol (VITAMIN  D3) tablet 1,000 Units  1,000 Units Oral Daily Dow Adolph N, DO   1,000 Units at 04/13/20 7062  . guaiFENesin-dextromethorphan (ROBITUSSIN DM) 100-10 MG/5ML syrup 10 mL  10 mL Oral Q4H PRN Darreld Mclean R, MD      . heparin ADULT infusion 100 units/mL (25000 units/263mL sodium chloride 0.45%)  1,350 Units/hr Intravenous Continuous Otelia Sergeant, RPH 13.5 mL/hr at 04/13/20 1249 1,350 Units/hr at 04/13/20 1249  . insulin aspart (novoLOG) injection 0-9 Units  0-9 Units Subcutaneous TID WC Hall, Carole N, DO      . Ipratropium-Albuterol (COMBIVENT) respimat 1 puff  1 puff Inhalation Q6H Hall, Carole N, DO      . methylPREDNISolone sodium succinate (SOLU-MEDROL) 125 mg/2 mL injection 96.25 mg  1 mg/kg Intravenous Q12H Hall, Carole N, DO   96.25 mg at 04/13/20 1244   Followed by  . [START ON 04/16/2020] predniSONE (DELTASONE) tablet 50 mg  50 mg Oral Daily Hall, Carole N, DO      . morphine 2 MG/ML injection 1 mg  1 mg Intravenous Q3H PRN Charlsie Quest, MD      . multivitamin with minerals tablet 1 tablet  1 tablet Oral Daily Hall, Carole N, DO      . ondansetron (ZOFRAN) tablet 4 mg  4 mg Oral Q6H PRN Charlsie Quest, MD       Or  . ondansetron (ZOFRAN) injection 4 mg  4 mg Intravenous Q6H PRN  Charlsie Quest, MD      . oxyCODONE (Oxy IR/ROXICODONE) immediate release tablet 5 mg  5 mg Oral Q4H PRN Charlsie Quest, MD      . remdesivir 100 mg in sodium chloride 0.9 % 100 mL IVPB  100 mg Intravenous Daily Otelia Sergeant, RPH   Stopped at 04/13/20 1115  . sodium chloride flush (NS) 0.9 % injection 3 mL  3 mL Intravenous Q12H Charlsie Quest, MD   3 mL at 04/13/20 0955  . zinc sulfate capsule 220 mg  220 mg Oral Daily Charlsie Quest, MD   220 mg at 04/13/20 6073   No current outpatient medications on file.   History reviewed. No pertinent past medical history.  History reviewed. No pertinent surgical history.  Social History Social History   Tobacco Use  . Smoking status: Current Every Day  Smoker    Types: Cigarettes  . Smokeless tobacco: Never Used  Substance Use Topics  . Alcohol use: Yes    Comment: socially  . Drug use: Never   Family History History reviewed. No pertinent family history.  Denies family history of peripheral artery disease, venous disease or renal disease.  No Known Allergies  REVIEW OF SYSTEMS (Negative unless checked)  Constitutional: [] Weight loss  [] Fever  [] Chills Cardiac: [x] Chest pain   [x] Chest pressure   [x] Palpitations   [x] Shortness of breath when laying flat   [x] Shortness of breath at rest   [x] Shortness of breath with exertion. Vascular:  [] Pain in legs with walking   [] Pain in legs at rest   [] Pain in legs when laying flat   [] Claudication   [] Pain in feet when walking  [] Pain in feet at rest  [] Pain in feet when laying flat   [] History of DVT   [] Phlebitis   [] Swelling in legs   [] Varicose veins   [] Non-healing ulcers Pulmonary:   [] Uses home oxygen   [] Productive cough   [] Hemoptysis   [] Wheeze  [] COPD   [] Asthma Neurologic:  [] Dizziness  [] Blackouts   [] Seizures   [] History of stroke   [] History of TIA  [] Aphasia   [] Temporary blindness   [] Dysphagia   [] Weakness or numbness in arms   [] Weakness or numbness in legs Musculoskeletal:  [] Arthritis   [] Joint swelling   [] Joint pain   [] Low back pain Hematologic:  [] Easy bruising  [] Easy bleeding   [] Hypercoagulable state   [] Anemic  [] Hepatitis Gastrointestinal:  [] Blood in stool   [] Vomiting blood  [] Gastroesophageal reflux/heartburn   [] Difficulty swallowing. Genitourinary:  [] Chronic kidney disease   [] Difficult urination  [] Frequent urination  [] Burning with urination   [] Blood in urine Skin:  [] Rashes   [] Ulcers   [] Wounds Psychological:  [] History of anxiety   []  History of major depression.  Physical Examination  Vitals:   04/13/20 1000 04/13/20 1100 04/13/20 1107 04/13/20 1230  BP: 123/90 129/74  106/73  Pulse: 97 96 100 (!) 104  Resp: 17 (!) 30 (!) 21 20  Temp:       TempSrc:      SpO2: 93% 94% 92% 95%  Weight:      Height:       Body mass index is 31.31 kg/m. Gen:  WD/WN, NAD Head: [place]/AT, No temporalis wasting. Prominent temp pulse not noted. Ear/Nose/Throat: Hearing grossly intact, nares w/o erythema or drainage, oropharynx w/o Erythema/Exudate Eyes: Sclera non-icteric, conjunctiva clear Neck: Trachea midline.  No JVD.  Pulmonary: Nonlabored, able to speak. Cardiac: RRR, normal S1, S2. Vascular:  Vessel  Right Left  Radial Palpable Palpable  Ulnar Palpable Palpable  Brachial Palpable Palpable  Carotid Palpable, without bruit Palpable, without bruit  Aorta Not palpable N/A  Femoral Palpable Palpable  Popliteal Palpable Palpable  PT Palpable Palpable  DP Palpable Palpable   Gastrointestinal: soft, non-tender/non-distended. No guarding/reflex.  Musculoskeletal: M/S 5/5 throughout.  Extremities without ischemic changes.  No deformity or atrophy. No edema. Neurologic: Sensation grossly intact in extremities.  Symmetrical.  Speech is fluent. Motor exam as listed above. Psychiatric: Judgment intact, Mood & affect appropriate for pt's clinical situation. Dermatologic: No rashes or ulcers noted.  No cellulitis or open wounds. Lymph : No Cervical, Axillary, or Inguinal lymphadenopathy.  CBC Lab Results  Component Value Date   WBC 11.8 (H) 04/13/2020   HGB 13.6 04/13/2020   HCT 40.7 04/13/2020   MCV 82.7 04/13/2020   PLT 472 (H) 04/13/2020   BMET    Component Value Date/Time   NA 135 04/13/2020 0605   K 4.5 04/13/2020 0605   CL 103 04/13/2020 0605   CO2 22 04/13/2020 0605   GLUCOSE 129 (H) 04/13/2020 0605   BUN 17 04/13/2020 0605   CREATININE 0.98 04/13/2020 0605   CALCIUM 8.9 04/13/2020 0605   GFRNONAA >60 04/13/2020 0605   Estimated Creatinine Clearance: 127.3 mL/min (by C-G formula based on SCr of 0.98 mg/dL).  COAG Lab Results  Component Value Date   INR 1.3 (H) 04/13/2020   Radiology CT Angio Chest PE W and/or Wo  Contrast  Result Date: 04/12/2020 CLINICAL DATA:  Right-sided chest pain, COVID pneumonia, progressive dyspnea EXAM: CT ANGIOGRAPHY CHEST WITH CONTRAST TECHNIQUE: Multidetector CT imaging of the chest was performed using the standard protocol during bolus administration of intravenous contrast. Multiplanar CT image reconstructions and MIPs were obtained to evaluate the vascular anatomy. CONTRAST:  75mL OMNIPAQUE IOHEXOL 350 MG/ML SOLN COMPARISON:  None. FINDINGS: Cardiovascular: The pulmonary arterial tree is suboptimally opacified, likely related to respiratory artifact. However, despite this, there is intraluminal filling defect identified within the right upper and lower lobar pulmonary arteries in keeping with acute pulmonary embolism. The central pulmonary arteries are not enlarged. No significant coronary artery calcification. No CT evidence of right heart strain. Global cardiac size within normal limits. No pericardial effusion. Thoracic aorta is unremarkable. Mediastinum/Nodes: No enlarged mediastinal, hilar, or axillary lymph nodes. Thyroid gland, trachea, and esophagus demonstrate no significant findings. 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 There is, however, superimposed consolidation and ground-glass infiltrate within the basilar right lower lobe and posterior segment of the right upper lobe peripherally most in keeping with acute pulmonary infarcts. Tiny right pleural effusion. No pneumothorax. No central obstructing lesion. Upper Abdomen: No acute abnormality. Musculoskeletal: No chest wall abnormality. No acute or significant osseous findings. Review of the MIP images confirms the above findings. IMPRESSION: Acute pulmonary embolism. Suboptimal imaging limits evaluation of the embolic burden, however, there is no CT evidence of  right heart strain. 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 Mild parenchymal involvement in this respect. Attempts are being made at this time to contact the ordering physician for direct communication. Electronically Signed   By: Helyn NumbersAshesh  Parikh MD   On: 04/12/2020 23:25   US Venous Img Lower Bilateral (DVT)  Result Date: 04/13/2020 CLINICAL DATA:  Leg pain, history of prior  pulmonary emboli shows in this 29 year old male EXAM: Bilateral LOWER EXTREMITY VENOUS DOPPLER ULTRASOUND TECHNIQUE: Gray-scale sonography with compression, as well as color and duplex ultrasound, were performed to evaluate the deep venous system(s) from the level of the common femoral vein through the popliteal and proximal calf veins. COMPARISON:  None FINDINGS: VENOUS Normal compressibility of the common femoral, superficial femoral, and popliteal veins, as well as the visualized calf veins. Visualized portions of profunda femoral vein and great saphenous vein unremarkable. No filling defects to suggest DVT on grayscale or color Doppler imaging. Doppler waveforms show normal direction of venous flow, normal respiratory plasticity and response to augmentation. OTHER Flow related artifact in the lower extremity veins bilaterally compatible with rouleaux flow, no sign of thrombus. Limitations: none IMPRESSION: Sonogram of the bilateral lower extremities, negative for deep venous thrombosis. Electronically Signed   By: Donzetta Kohut M.D.   On: 04/13/2020 11:33   DG Chest Portable 1 View  Result Date: 04/12/2020 CLINICAL DATA:  Shortness of breath, positive COVID test 12/2 EXAM: PORTABLE CHEST 1 VIEW COMPARISON:  None. FINDINGS: Low lung volumes secondary to poor inspiration. Mild patchy density at the lung bases. No pleural effusion or pneumothorax. Cardiomediastinal contours are within normal limits.  IMPRESSION: Mild patchy density at the lung bases, which may reflect atelectasis or atypical pneumonia in the appropriate setting. Electronically Signed   By: Guadlupe Spanish M.D.   On: 04/12/2020 21:52   Assessment/Plan Chad Cannon is a 29 year old male without significant medical history who presents to the ED for evaluation of worsening shortness of breath and pleuritic chest pain.  1.  Pulmonary embolism: Patient presents with progressively worsening shortness of breath and chest discomfort. Patient found to have acute pulmonary embolism without heart strain on CTA. Agree with initiation of heparin. Patient does state that since the initiation of heparin his shortness of breath has improved. Has not had the opportunity to ambulate much at this time. In setting of improved symptoms, with small clot burden and the absence of right heart strain would recommend observation at this point. We will continue to follow the patient while he is in house. We will go ahead and make him n.p.o. after midnight just in case we decide to move forward with a pulmonary thrombectomy/thrombolysis tomorrow. We do have up to 2 weeks to intervene.  2. DVT: No DVT bilaterally found on ultrasound  3. COVID-19: On steroids and antivirals Being followed by medicine service  Discussed with Dr. Weldon Inches, PA-C  04/13/2020 1:29 PM  This note was created with Dragon medical transcription system.  Any error is purely unintentional

## 2020-04-13 NOTE — Progress Notes (Signed)
PT Cancellation Note  Patient Details Name: Cadden Elizondo MRN: [id number] DOB: 03-06-1991   Cancelled Treatment:    Reason Eval/Treat Not Completed: Medical issues which prohibited therapy Acute PE found last night (12/13), heparin drip started.  Will need 48 hrs of anticoags before initiating care with PT per protocol.  Will maintain on caseload and see when appropriate.   Malachi Pro, DPT 04/13/2020, 2:15 PM

## 2020-04-13 NOTE — Progress Notes (Signed)
*  PRELIMINARY RESULTS* Echocardiogram 2D Echocardiogram has been performed.  Chad Cannon 04/13/2020, 2:03 PM

## 2020-04-13 NOTE — ED Notes (Signed)
Pt declined bg due to eating at this time

## 2020-04-14 ENCOUNTER — Encounter
Admission: EM | Disposition: A | Discharge status: Home / Self Care | Payer: Self-pay | Source: Home / Self Care | Attending: Family Medicine

## 2020-04-14 DIAGNOSIS — U071 COVID-19: Secondary | ICD-10-CM | POA: Diagnosis present

## 2020-04-14 LAB — GLUCOSE, CAPILLARY
Glucose-Capillary: 123 mg/dL — ABNORMAL HIGH (ref 70–99)
Glucose-Capillary: 139 mg/dL — ABNORMAL HIGH (ref 70–99)

## 2020-04-14 LAB — CBC WITH DIFFERENTIAL/PLATELET
Abs Immature Granulocytes: 0.15 10*3/uL — ABNORMAL HIGH (ref 0.00–0.07)
Basophils Absolute: 0 10*3/uL (ref 0.0–0.1)
Basophils Relative: 0 %
Eosinophils Absolute: 0 10*3/uL (ref 0.0–0.5)
Eosinophils Relative: 0 %
HCT: 39.3 % (ref 39.0–52.0)
Hemoglobin: 13.3 g/dL (ref 13.0–17.0)
Immature Granulocytes: 1 %
Lymphocytes Relative: 16 %
Lymphs Abs: 2.3 10*3/uL (ref 0.7–4.0)
MCH: 28.1 pg (ref 26.0–34.0)
MCHC: 33.8 g/dL (ref 30.0–36.0)
MCV: 83.1 fL (ref 80.0–100.0)
Monocytes Absolute: 1.6 10*3/uL — ABNORMAL HIGH (ref 0.1–1.0)
Monocytes Relative: 11 %
Neutro Abs: 10.8 10*3/uL — ABNORMAL HIGH (ref 1.7–7.7)
Neutrophils Relative %: 72 %
Platelets: 476 10*3/uL — ABNORMAL HIGH (ref 150–400)
RBC: 4.73 MIL/uL (ref 4.22–5.81)
RDW: 12.7 % (ref 11.5–15.5)
WBC: 14.9 10*3/uL — ABNORMAL HIGH (ref 4.0–10.5)
nRBC: 0 % (ref 0.0–0.2)

## 2020-04-14 LAB — COMPREHENSIVE METABOLIC PANEL
ALT: 47 U/L — ABNORMAL HIGH (ref 0–44)
AST: 25 U/L (ref 15–41)
Albumin: 3.3 g/dL — ABNORMAL LOW (ref 3.5–5.0)
Alkaline Phosphatase: 47 U/L (ref 38–126)
Anion gap: 10 (ref 5–15)
BUN: 24 mg/dL — ABNORMAL HIGH (ref 6–20)
CO2: 25 mmol/L (ref 22–32)
Calcium: 8.8 mg/dL — ABNORMAL LOW (ref 8.9–10.3)
Chloride: 103 mmol/L (ref 98–111)
Creatinine, Ser: 0.88 mg/dL (ref 0.61–1.24)
GFR, Estimated: >60 mL/min (ref >60)
Glucose, Bld: 113 mg/dL — ABNORMAL HIGH (ref 70–99)
Potassium: 3.8 mmol/L (ref 3.5–5.1)
Sodium: 138 mmol/L (ref 135–145)
Total Bilirubin: 0.5 mg/dL (ref 0.3–1.2)
Total Protein: 7.3 g/dL (ref 6.5–8.1)

## 2020-04-14 LAB — PHOSPHORUS: Phosphorus: 3.3 mg/dL (ref 2.5–4.6)

## 2020-04-14 LAB — HEPARIN LEVEL (UNFRACTIONATED): Heparin Unfractionated: 0.4 IU/mL (ref 0.30–0.70)

## 2020-04-14 LAB — MAGNESIUM: Magnesium: 2.4 mg/dL (ref 1.7–2.4)

## 2020-04-14 LAB — FIBRIN DERIVATIVES D-DIMER (ARMC ONLY): Fibrin derivatives D-dimer (ARMC): 2091.4 ng/mL (FEU) — ABNORMAL HIGH (ref 0.00–499.00)

## 2020-04-14 LAB — C-REACTIVE PROTEIN: CRP: 15.9 mg/dL — ABNORMAL HIGH (ref <1.0)

## 2020-04-14 LAB — FERRITIN: Ferritin: 578 ng/mL — ABNORMAL HIGH (ref 24–336)

## 2020-04-14 LAB — CBG MONITORING, ED: Glucose-Capillary: 97 mg/dL (ref 70–99)

## 2020-04-14 SURGERY — PULMONARY THROMBECTOMY
Anesthesia: Moderate Sedation

## 2020-04-14 MED ORDER — IPRATROPIUM-ALBUTEROL 20-100 MCG/ACT IN AERS
1.0000 | INHALATION_SPRAY | Freq: Four times a day (QID) | RESPIRATORY_TRACT | Status: DC
  Administered 2020-04-14 – 2020-04-16 (×9): 1 via RESPIRATORY_TRACT
  Filled 2020-04-14: qty 4

## 2020-04-14 NOTE — Progress Notes (Signed)
PROGRESS NOTE    Chad Cannon  HYW:[id number] DOB: 08-24-1990 DOA: 04/12/2020 PCP: Patient, No Pcp Per  Chief Complaint  Patient presents with  . Chest Pain  . Shortness of Breath    Brief Narrative: Chad Cannon 29 y.o.malewithoutsignificant medical historywho presents to [facility] ED for evaluation of worsening shortness of breath and pleuritic chest pain.  Patient states about 2 weeks ago he began feeling generally unwell with fatigue and cough. He had Chad Cannon positive COVID-19 test on 03/31/2020. Since then he has been having progressive symptoms of fevers, chills, diaphoresis, nonproductive cough, body aches/myalgias. He had some shortness of breath intermittently however yesterday developed severe shortness of breath occurring even at rest. He began to have right-sided pleuritic chest pain with inspiration. He has felt as if his heart has been racing. He denies any swelling in his legs or calf pain. Symptoms became so severe he came to the ED for further evaluation.  He has not received any of the COVID-19 vaccinations.  ED Course: Initial vitals showed BP 148/85, pulse 170, RR 35, temp 99.0 F, SPO2 89% on room air. Patient was placed on 4 L supplemental O2 via Chad Cannon with SPO2 96%.  Portable chest x-ray showed mild patchy density at the lung bases.  CTA chest PE study showed acute pulmonary embolism without CT evidence of right heart strain. Developing pulmonary infarcts within the right upper and right lower lobes are seen. 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  EDP discussed with on-call vascular surgery, Dr. Wyn Quaker, who recommended starting IV heparin and they will consult in Alicia Seib.m. to discuss potential thrombectomy. Patient was also given IV Decadron 10 mg and started on IV remdesivir. The hospitalist service was consulted to admit for further evaluation and management.   Assessment & Plan:    Principal Problem:   Acute hypoxemic respiratory failure due to COVID-19 [facility]) Active Problems:   Acute pulmonary embolism without acute cor pulmonale (HCC)   COVID-19  Acute Hypoxic Respiratory Failure 2/2 COVID Pneumonia and Acute Pulmonary Embolism Currently requiring 2 L [place] CT with acute PE, developing pulm infarcts within R upper lobe and R lower lobe.  Superimposed parenchymal infiltrates.   Continue steroids, remdesivir, baricitinib Strict I/O, daily weights  Echo with normal RVSF, mildly dilated PA - bilateral LE [place] without DVT Heparin for DVT - vascular was following and considering intervention, but pt without RH strain and hemodynamically stable.  Pt not particularly interested in procedure.  Discussed with Dr. Wyn Quaker, will defer intervention at this point as no clear indication at this time and pt not interested. Prone as able, OOB, IS, flutter  COVID-19 Labs  Recent Labs    04/14/20 0508  FERRITIN 578*  CRP 15.9*    Lab Results  Component Value Date   SARSCOV2NAA POSITIVE (Shourya Macpherson) 04/13/2020   Elevated LFT: continue to monitor  Prediabetes  Steroid induced hyperglycemia: continue SSI  DVT prophylaxis: heparin Code Status: full  Family Communication: none at bedside Disposition:   Status is: Inpatient  Remains inpatient appropriate because:Inpatient level of care appropriate due to severity of illness   Dispo:  Patient From: Home  Planned Disposition: Home  Expected discharge date: 04/16/2020  Medically stable for discharge: No        Consultants:   vascular  Procedures:  Echo IMPRESSIONS    1. Left ventricular ejection fraction, by estimation, is 55 to 60%. The  left ventricle has normal function. Left ventricular endocardial border  not optimally  defined to evaluate regional wall motion. Left ventricular  diastolic parameters were normal.  2. Right ventricular systolic function is normal. The right ventricular  size is normal. Tricuspid  regurgitation signal is inadequate for assessing  PA pressure.  3. The mitral valve is normal in structure. No evidence of mitral valve  regurgitation. No evidence of mitral stenosis.  4. The aortic valve is tricuspid. Aortic valve regurgitation is not  visualized. No aortic stenosis is present.  5. Mildly dilated pulmonary artery.   Venous US IMPRESSION: Sonogram of the bilateral lower extremities, negative for deep venous thrombosis.  Antimicrobials: Anti-infectives (From admission, onward)   Start     Dose/Rate Route Frequency Ordered Stop   04/13/20 1000  remdesivir 100 mg in sodium chloride 0.9 % 100 mL IVPB       "Followed by" Linked Group Details   100 mg 200 mL/hr over 30 Minutes Intravenous Daily 04/12/20 2245 04/17/20 0959   04/12/20 2300  remdesivir 200 mg in sodium chloride 0.9% 250 mL IVPB       "Followed by" Linked Group Details   200 mg 580 mL/hr over 30 Minutes Intravenous Once 04/12/20 2245 04/13/20 0134     Subjective: No new complaints Feels Chad Cannon little better He's not particularly interested in any intervention - knows someone with blood clot that was just on anticoagulation and this is what he wants   Objective: Vitals:   04/14/20 0900 04/14/20 1030 04/14/20 1100 04/14/20 1115  BP: 113/65 118/78 116/75 136/88  Pulse: 88 74 99 91  Resp:  16  19  Temp:      TempSrc:      SpO2: 100% 95% 98% 96%  Weight:    90.6 kg  Height:    5\' 9"  (1.753 m)    Intake/Output Summary (Last 24 hours) at 04/14/2020 1904 Last data filed at 04/14/2020 0532 Gross per 24 hour  Intake 288.34 ml  Output --  Net 288.34 ml   Filed Weights   04/12/20 2150 04/14/20 1115  Weight: 96.2 kg 90.6 kg    Examination:  General exam: Appears calm and comfortable  Respiratory system: Clear to auscultation. Respiratory effort normal. Cardiovascular system: S1 & S2 heard, RRR. Gastrointestinal system: Abdomen is nondistended, soft and nontender. Central nervous system: Alert and  oriented. No focal neurological deficits. Extremities: no LEE Skin: No rashes, lesions or ulcers Psychiatry: Judgement and insight appear normal. Mood & affect appropriate.     Data Reviewed: I have personally reviewed following labs and imaging studies  CBC: Recent Labs  Lab 04/12/20 2158 04/13/20 0605 04/14/20 0508  WBC 10.7* 11.8* 14.9*  NEUTROABS  --  10.0* 10.8*  HGB 14.7 13.6 13.3  HCT 42.8 40.7 39.3  MCV 81.8 82.7 83.1  PLT 522* 472* 476*    Basic Metabolic Panel: Recent Labs  Lab 04/12/20 2158 04/13/20 0605 04/14/20 0508  NA 136 135 138  K 3.7 4.5 3.8  CL 103 103 103  CO2 20* 22 25  GLUCOSE 128* 129* 113*  BUN 18 17 24*  CREATININE 1.11 0.98 0.88  CALCIUM 8.8* 8.9 8.8*  MG  --   --  2.4  PHOS  --   --  3.3    GFR: Estimated Creatinine Clearance: 137.9 mL/min (by C-G formula based on SCr of 0.88 mg/dL).  Liver Function Tests: Recent Labs  Lab 04/13/20 0605 04/14/20 0508  AST 24 25  ALT 51* 47*  ALKPHOS 49 47  BILITOT 0.7 0.5  PROT 7.7 7.3  ALBUMIN 3.4* 3.3*    CBG: Recent Labs  Lab 04/13/20 2019 04/14/20 0729 04/14/20 1624  GLUCAP 144* 97 123*     Recent Results (from the past 240 hour(s))  Resp Panel by RT-PCR (Flu Tifanny Dollens&B, Covid) Nasopharyngeal Swab     Status: Abnormal   Collection Time: 04/13/20 10:16 PM   Specimen: Nasopharyngeal Swab; Nasopharyngeal(NP) swabs in vial transport medium  Result Value Ref Range Status   SARS Coronavirus 2 by RT PCR POSITIVE (Chad Cannon) NEGATIVE Final    Comment: RESULT CALLED TO, READ BACK BY AND VERIFIED WITHJonny Ruiz [facility] RN 2322 04/13/20 HNM (NOTE) SARS-CoV-2 target nucleic acids are DETECTED.  The SARS-CoV-2 RNA is generally detectable in upper respiratory specimens during the acute phase of infection. Positive results are indicative of the presence of the identified virus, but do not rule out bacterial infection or co-infection with other pathogens not detected by the test. Clinical correlation with  patient history and other diagnostic information is necessary to determine patient infection status. The expected result is Negative.  Fact Sheet for Patients: BloggerCourse.com  Fact Sheet for Healthcare Providers: SeriousBroker.it  This test is not yet approved or cleared by the [place] FDA and  has been authorized for detection and/or diagnosis of SARS-CoV-2 by FDA under an Emergency Use Authorization (EUA).  This EUA will remain in effect (meaning this test can b e used) for the duration of  the COVID-19 declaration under Section 564(b)(1) of the Act, 21 U.S.C. section 360bbb-3(b)(1), unless the authorization is terminated or revoked sooner.     Influenza Godwin Tedesco by PCR NEGATIVE NEGATIVE Final   Influenza B by PCR NEGATIVE NEGATIVE Final    Comment: (NOTE) The Xpert Xpress SARS-CoV-2/FLU/RSV plus assay is intended as an aid in the diagnosis of influenza from Nasopharyngeal swab specimens and should not be used as Maverick Patman sole basis for treatment. Nasal washings and aspirates are unacceptable for Xpert Xpress SARS-CoV-2/FLU/RSV testing.  Fact Sheet for Patients: BloggerCourse.com  Fact Sheet for Healthcare Providers: SeriousBroker.it  This test is not yet approved or cleared by the [place] FDA and has been authorized for detection and/or diagnosis of SARS-CoV-2 by FDA under an Emergency Use Authorization (EUA). This EUA will remain in effect (meaning this test can be used) for the duration of the COVID-19 declaration under Section 564(b)(1) of the Act, 21 U.S.C. section 360bbb-3(b)(1), unless the authorization is terminated or revoked.  Performed at [facility], [address] [place], [place] [address]          Radiology Studies: CT Angio Chest PE W and/or Wo Contrast  Result Date: 04/12/2020 CLINICAL DATA:  Right-sided chest pain, COVID pneumonia,  progressive dyspnea EXAM: CT ANGIOGRAPHY CHEST WITH CONTRAST TECHNIQUE: Multidetector CT imaging of the chest was performed using the standard protocol during bolus administration of intravenous contrast. Multiplanar CT image reconstructions and MIPs were obtained to evaluate the vascular anatomy. CONTRAST:  74mL OMNIPAQUE IOHEXOL 350 MG/ML SOLN COMPARISON:  None. FINDINGS: Cardiovascular: The pulmonary arterial tree is suboptimally opacified, likely related to respiratory artifact. However, despite this, there is intraluminal filling defect identified within the right upper and lower lobar pulmonary arteries in keeping with acute pulmonary embolism. The central pulmonary arteries are not enlarged. No significant coronary artery calcification. No CT evidence of right heart strain. Global cardiac size within normal limits. No pericardial effusion. Thoracic aorta is unremarkable. Mediastinum/Nodes: No enlarged mediastinal, hilar, or axillary lymph nodes. Thyroid gland, trachea, and esophagus demonstrate no significant findings. Lungs/Pleura: There is sparse  peripheral ground-glass pulmonary infiltrate and subpleural reticulation, best appreciated within the left lung, in keeping with changes of subacute to chronic COVID-19 pneumonia. There is, however, superimposed consolidation and ground-glass infiltrate within the basilar right lower lobe and posterior segment of the right upper lobe peripherally most in keeping with acute pulmonary infarcts. Tiny right pleural effusion. No pneumothorax. No central obstructing lesion. Upper Abdomen: No acute abnormality. Musculoskeletal: No chest wall abnormality. No acute or significant osseous findings. Review of the MIP images confirms the above findings. IMPRESSION: Acute pulmonary embolism. Suboptimal imaging limits evaluation of the embolic burden, however, there is no CT evidence of right heart strain. Developing pulmonary infarcts within the right upper lobe and right lower  lobe. 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 Mild parenchymal involvement in this respect. Attempts are being made at this time to contact the ordering physician for direct communication. Electronically Signed   By: Helyn Numbers MD   On: 04/12/2020 23:25   US Venous Img Lower Bilateral (DVT)  Result Date: 04/13/2020 CLINICAL DATA:  Leg pain, history of prior pulmonary emboli shows in this 29 year old male EXAM: Bilateral LOWER EXTREMITY VENOUS DOPPLER ULTRASOUND TECHNIQUE: Gray-scale sonography with compression, as well as color and duplex ultrasound, were performed to evaluate the deep venous system(s) from the level of the common femoral vein through the popliteal and proximal calf veins. COMPARISON:  None FINDINGS: VENOUS Normal compressibility of the common femoral, superficial femoral, and popliteal veins, as well as the visualized calf veins. Visualized portions of profunda femoral vein and great saphenous vein unremarkable. No filling defects to suggest DVT on grayscale or color Doppler imaging. Doppler waveforms show normal direction of venous flow, normal respiratory plasticity and response to augmentation. OTHER Flow related artifact in the lower extremity veins bilaterally compatible with rouleaux flow, no sign of thrombus. Limitations: none IMPRESSION: Sonogram of the bilateral lower extremities, negative for deep venous thrombosis. Electronically Signed   By: Donzetta Kohut M.D.   On: 04/13/2020 11:33   DG Chest Portable 1 View  Result Date: 04/12/2020 CLINICAL DATA:  Shortness of breath, positive COVID test 12/2 EXAM: PORTABLE CHEST 1 VIEW COMPARISON:  None. FINDINGS: Low lung volumes secondary to poor inspiration. Mild patchy density at the lung bases. No pleural effusion or pneumothorax. Cardiomediastinal contours are within normal limits. IMPRESSION: Mild patchy density at the lung bases, which may reflect atelectasis or atypical  pneumonia in the appropriate setting. Electronically Signed   By: Guadlupe Spanish M.D.   On: 04/12/2020 21:52   ECHOCARDIOGRAM COMPLETE  Result Date: 04/13/2020    ECHOCARDIOGRAM REPORT   Patient Name:   JASSIAH VIVIANO Date of Exam: 04/13/2020 Medical Rec #:  [id number]         Height:       69.0 in Accession #:    [id number]        Weight:       212.0 lb Date of Birth:  04-May-1990          BSA:          2.118 m Patient Age:    29 years          BP:           129/74 mmHg Patient Gender: M                 HR:           100 bpm. Exam Location:  [facility] Procedure: 2D Echo, Color Doppler and  Cardiac Doppler Indications:     I26.99 Pulmonary Embolus  History:         Patient has no prior history of Echocardiogram examinations.                  Signs/Symptoms:Shortness of Breath. Pt tested positive for                  COVID-19 on 03/31/20.  Sonographer:     Humphrey Rolls RDCS (AE) Referring Phys:  [id number] Floreen Comber PATEL Diagnosing Phys: Yvonne Kendall MD  Sonographer Comments: Suboptimal subcostal window. IMPRESSIONS  1. Left ventricular ejection fraction, by estimation, is 55 to 60%. The left ventricle has normal function. Left ventricular endocardial border not optimally defined to evaluate regional wall motion. Left ventricular diastolic parameters were normal.  2. Right ventricular systolic function is normal. The right ventricular size is normal. Tricuspid regurgitation signal is inadequate for assessing PA pressure.  3. The mitral valve is normal in structure. No evidence of mitral valve regurgitation. No evidence of mitral stenosis.  4. The aortic valve is tricuspid. Aortic valve regurgitation is not visualized. No aortic stenosis is present.  5. Mildly dilated pulmonary artery. FINDINGS  Left Ventricle: Left ventricular ejection fraction, by estimation, is 55 to 60%. The left ventricle has normal function. Left ventricular endocardial border not optimally defined to evaluate regional wall motion. The left  ventricular internal cavity size was normal in size. There is no left ventricular hypertrophy. Left ventricular diastolic parameters were normal. Right Ventricle: The right ventricular size is normal. No increase in right ventricular wall thickness. Right ventricular systolic function is normal. Tricuspid regurgitation signal is inadequate for assessing PA pressure. Left Atrium: Left atrial size was normal in size. Right Atrium: Right atrial size was normal in size. Pericardium: Trivial pericardial effusion is present. Mitral Valve: The mitral valve is normal in structure. No evidence of mitral valve regurgitation. No evidence of mitral valve stenosis. MV peak gradient, 2.1 mmHg. The mean mitral valve gradient is 1.0 mmHg. Tricuspid Valve: The tricuspid valve is not well visualized. Tricuspid valve regurgitation is not demonstrated. Aortic Valve: The aortic valve is tricuspid. Aortic valve regurgitation is not visualized. No aortic stenosis is present. Aortic valve mean gradient measures 1.0 mmHg. Aortic valve peak gradient measures 2.4 mmHg. Aortic valve area, by VTI measures 4.52 cm. Pulmonic Valve: The pulmonic valve was normal in structure. Pulmonic valve regurgitation is trivial. No evidence of pulmonic stenosis. Aorta: The aortic root is normal in size and structure. Pulmonary Artery: The pulmonary artery is mildly dilated. Venous: The inferior vena cava was not well visualized. IAS/Shunts: The interatrial septum was not well visualized.  LEFT VENTRICLE PLAX 2D LVIDd:         4.22 cm  Diastology LVIDs:         3.11 cm  LV e' medial:    9.79 cm/s LV PW:         1.03 cm  LV E/e' medial:  6.7 LV IVS:        0.87 cm  LV e' lateral:   11.00 cm/s LVOT diam:     2.40 cm  LV E/e' lateral: 6.0 LV SV:         50 LV SV Index:   24 LVOT Area:     4.52 cm  LEFT ATRIUM         Index      RIGHT ATRIUM           Index LA  diam:    2.80 cm 1.32 cm/m RA Area:     12.54 cm 5.92 cm/m  AORTIC VALVE                   PULMONIC  VALVE AV Area (Vmax):    3.79 cm    PV Vmax:       0.78 m/s AV Area (Vmean):   3.95 cm    PV Vmean:      55.900 cm/s AV Area (VTI):     4.52 cm    PV VTI:        0.117 m AV Vmax:           76.80 cm/s  PV Peak grad:  2.4 mmHg AV Vmean:          50.100 cm/s PV Mean grad:  1.0 mmHg AV VTI:            0.111 m AV Peak Grad:      2.4 mmHg AV Mean Grad:      1.0 mmHg LVOT Vmax:         64.30 cm/s LVOT Vmean:        43.700 cm/s LVOT VTI:          0.111 m LVOT/AV VTI ratio: 1.00  AORTA Ao Root diam: 3.50 cm MITRAL VALVE MV Area (PHT): 7.16 cm    SHUNTS MV Peak grad:  2.1 mmHg    Systemic VTI:  0.11 m MV Mean grad:  1.0 mmHg    Systemic Diam: 2.40 cm MV Vmax:       0.72 m/s MV Vmean:      54.5 cm/s MV Decel Time: 106 msec MV E velocity: 66.00 cm/s MV Revecca Nachtigal velocity: 74.20 cm/s MV E/Kadrian Partch ratio:  0.89 Christopher End MD Electronically signed by Yvonne Kendall MD Signature Date/Time: 04/13/2020/2:30:09 PM    Final         Scheduled Meds: . acetaminophen  650 mg Oral Q6H  . vitamin C  500 mg Oral Daily  . baricitinib  4 mg Oral Daily  . cholecalciferol  1,000 Units Oral Daily  . insulin aspart  0-9 Units Subcutaneous TID WC  . Ipratropium-Albuterol  1 puff Inhalation Q6H  . methylPREDNISolone (SOLU-MEDROL) injection  1 mg/kg Intravenous Q12H   Followed by  . [START ON 04/16/2020] predniSONE  50 mg Oral Daily  . multivitamin with minerals  1 tablet Oral Daily  . sodium chloride flush  3 mL Intravenous Q12H  . zinc sulfate  220 mg Oral Daily   Continuous Infusions: . heparin 1,350 Units/hr (04/14/20 0911)  . remdesivir 100 mg in NS 100 mL Stopped (04/14/20 1115)     LOS: 2 days    Time spent: over 30 min    Lacretia Nicks, MD [facility]   To contact the attending provider between 7A-7P or the covering provider during after hours 7P-7A, please log into the web site [email or web address] and access using universal [place] password for that web site. If you do not have the password, please call  the hospital operator.  04/14/2020, 7:04 PM

## 2020-04-14 NOTE — Progress Notes (Signed)
ANTICOAGULATION CONSULT NOTE  Pharmacy Consult for heparin infusion Indication: pulmonary embolus  No Known Allergies  Patient Measurements: Height: 5\' 9"  (175.3 cm) Weight: 96.2 kg (212 lb) IBW/kg (Calculated) : 70.7 Heparin Dosing Weight: 90.7kg  Vital Signs: Temp: 97.8 F (36.6 C) (12/15 0041) Temp Source: Oral (12/15 0041) BP: 131/74 (12/15 0530) Pulse Rate: 96 (12/15 0530)  Labs: Recent Labs    04/12/20 2158 04/12/20 2158 04/12/20 2335 04/13/20 0605 04/13/20 1240 04/13/20 1900 04/14/20 0508  HGB 14.7  --   --  13.6  --   --  13.3  HCT 42.8  --   --  40.7  --   --  39.3  PLT 522*  --   --  472*  --   --  476*  APTT  --   --   --  106*  --   --   --   LABPROT  --   --   --  15.3*  --   --   --   INR  --   --   --  1.3*  --   --   --   HEPARINUNFRC  --    < >  --  0.91* 0.50 0.52 0.40  CREATININE 1.11  --   --  0.98  --   --   --   TROPONINIHS 8  --  5  --   --   --   --    < > = values in this interval not displayed.    Estimated Creatinine Clearance: 127.3 mL/min (by C-G formula based on SCr of 0.98 mg/dL).   Goal of Therapy:  Heparin level 0.3-0.7 units/ml Monitor platelets by anticoagulation protocol: Yes   Date Time Level Rate 12/14 0605 0.91 1550 units/hr - supratherapeutic 12/14 1240 0.50 1350 units/hr - therapeutic x1 12/14 1900 0.52 1350 units/hr - therapeutic x 2 12/15  0508 0.40 1350 units/hr - therapeutic x 3   Plan:  Continue with heparin infusion at 1350 units/hr Therapeutic x 3; Recheck anti-Xa level and CBC with AM labs. Continue to monitor H&H and platelets  1/16, PharmD, [facility] 04/14/2020 5:58 AM

## 2020-04-15 LAB — COMPREHENSIVE METABOLIC PANEL
ALT: 44 U/L (ref 0–44)
AST: 23 U/L (ref 15–41)
Albumin: 3.1 g/dL — ABNORMAL LOW (ref 3.5–5.0)
Alkaline Phosphatase: 45 U/L (ref 38–126)
Anion gap: 8 (ref 5–15)
BUN: 21 mg/dL — ABNORMAL HIGH (ref 6–20)
CO2: 26 mmol/L (ref 22–32)
Calcium: 9 mg/dL (ref 8.9–10.3)
Chloride: 105 mmol/L (ref 98–111)
Creatinine, Ser: 0.86 mg/dL (ref 0.61–1.24)
GFR, Estimated: >60 mL/min (ref >60)
Glucose, Bld: 127 mg/dL — ABNORMAL HIGH (ref 70–99)
Potassium: 4.9 mmol/L (ref 3.5–5.1)
Sodium: 139 mmol/L (ref 135–145)
Total Bilirubin: 0.5 mg/dL (ref 0.3–1.2)
Total Protein: 7.4 g/dL (ref 6.5–8.1)

## 2020-04-15 LAB — CBC WITH DIFFERENTIAL/PLATELET
Abs Immature Granulocytes: 0.19 10*3/uL — ABNORMAL HIGH (ref 0.00–0.07)
Basophils Absolute: 0 10*3/uL (ref 0.0–0.1)
Basophils Relative: 0 %
Eosinophils Absolute: 0 10*3/uL (ref 0.0–0.5)
Eosinophils Relative: 0 %
HCT: 39.8 % (ref 39.0–52.0)
Hemoglobin: 13.3 g/dL (ref 13.0–17.0)
Immature Granulocytes: 2 %
Lymphocytes Relative: 13 %
Lymphs Abs: 1.5 10*3/uL (ref 0.7–4.0)
MCH: 27.5 pg (ref 26.0–34.0)
MCHC: 33.4 g/dL (ref 30.0–36.0)
MCV: 82.4 fL (ref 80.0–100.0)
Monocytes Absolute: 0.4 10*3/uL (ref 0.1–1.0)
Monocytes Relative: 3 %
Neutro Abs: 9.7 10*3/uL — ABNORMAL HIGH (ref 1.7–7.7)
Neutrophils Relative %: 82 %
Platelets: 476 10*3/uL — ABNORMAL HIGH (ref 150–400)
RBC: 4.83 MIL/uL (ref 4.22–5.81)
RDW: 12.8 % (ref 11.5–15.5)
WBC: 11.7 10*3/uL — ABNORMAL HIGH (ref 4.0–10.5)
nRBC: 0 % (ref 0.0–0.2)

## 2020-04-15 LAB — GLUCOSE, CAPILLARY
Glucose-Capillary: 111 mg/dL — ABNORMAL HIGH (ref 70–99)
Glucose-Capillary: 141 mg/dL — ABNORMAL HIGH (ref 70–99)

## 2020-04-15 LAB — FIBRIN DERIVATIVES D-DIMER (ARMC ONLY): Fibrin derivatives D-dimer (ARMC): 819.16 ng/mL (FEU) — ABNORMAL HIGH (ref 0.00–499.00)

## 2020-04-15 LAB — FERRITIN: Ferritin: 691 ng/mL — ABNORMAL HIGH (ref 24–336)

## 2020-04-15 LAB — C-REACTIVE PROTEIN: CRP: 9.6 mg/dL — ABNORMAL HIGH (ref <1.0)

## 2020-04-15 LAB — MAGNESIUM: Magnesium: 2.2 mg/dL (ref 1.7–2.4)

## 2020-04-15 LAB — PHOSPHORUS: Phosphorus: 3.7 mg/dL (ref 2.5–4.6)

## 2020-04-15 LAB — HEPARIN LEVEL (UNFRACTIONATED): Heparin Unfractionated: <0.1 IU/mL — ABNORMAL LOW (ref 0.30–0.70)

## 2020-04-15 MED ORDER — HEPARIN BOLUS VIA INFUSION
3000.0000 [IU] | Freq: Once | INTRAVENOUS | Status: DC
  Filled 2020-04-15: qty 3000

## 2020-04-15 MED ORDER — CHLORPROMAZINE HCL 10 MG PO TABS
10.0000 mg | ORAL_TABLET | Freq: Once | ORAL | Status: AC
  Administered 2020-04-16: 10 mg via ORAL
  Filled 2020-04-15: qty 1

## 2020-04-15 MED ORDER — RIVAROXABAN 20 MG PO TABS: 20.0000 mg | ORAL_TABLET | Freq: Every day | ORAL | Status: DC

## 2020-04-15 MED ORDER — RIVAROXABAN 15 MG PO TABS
15.0000 mg | ORAL_TABLET | Freq: Two times a day (BID) | ORAL | Status: DC
  Administered 2020-04-15 – 2020-04-16 (×3): 15 mg via ORAL
  Filled 2020-04-15 (×5): qty 1

## 2020-04-15 NOTE — Progress Notes (Addendum)
PROGRESS NOTE    Chad Cannon  AUQ:[id number] DOB: [birth year] DOA: 04/12/2020 PCP: Patient, No Pcp Per  Chief Complaint  Patient presents with  . Chest Pain  . Shortness of Breath    Brief Narrative: Chad Underwoodis Chad Cannon 29 y.o.malewithoutsignificant medical historywho presents to [facility] ED for evaluation of worsening shortness of breath and pleuritic chest pain.  Patient states about 2 weeks ago he began feeling generally unwell with fatigue and cough. He had Chad Cannon positive COVID-19 test on 03/31/2020. Since then he has been having progressive symptoms of fevers, chills, diaphoresis, nonproductive cough, body aches/myalgias. He had some shortness of breath intermittently however yesterday developed severe shortness of breath occurring even at rest. He began to have right-sided pleuritic chest pain with inspiration. He has felt as if his heart has been racing. He denies any swelling in his legs or calf pain. Symptoms became so severe he came to the ED for further evaluation.  He has not received any of the COVID-19 vaccinations.  ED Course: Initial vitals showed BP 148/85, pulse 170, RR 35, temp 99.0 F, SPO2 89% on room air. Patient was placed on 4 L supplemental O2 via [place] with SPO2 96%.  Portable chest x-ray showed mild patchy density at the lung bases.  CTA chest PE study showed acute pulmonary embolism without CT evidence of right heart strain. Developing pulmonary infarcts within the right upper and right lower lobes are seen. 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  EDP discussed with on-call vascular surgery, Dr. Wyn Quaker, who recommended starting IV heparin and they will consult in Chad Cannon.Chad Cannon. Patient was also given IV Decadron 10 mg and started on IV remdesivir. The hospitalist service was consulted to admit for further evaluation and management.   Assessment & Plan:    Principal Problem:   Acute hypoxemic respiratory failure due to COVID-19 [facility]) Active Problems:   Acute pulmonary embolism without acute cor pulmonale (HCC)   COVID-19  Acute Hypoxic Respiratory Failure 2/2 COVID Pneumonia and Acute Pulmonary Embolism Improved today, currently on RA CT with acute PE, developing pulm infarcts within R upper lobe and R lower lobe.  Superimposed parenchymal infiltrates.   Continue steroids, remdesivir, baricitinib Strict I/O, daily weights  Echo with normal RVSF, mildly dilated PA - bilateral LE [place] without DVT Transition to xarelto today, consult TOC team to ensure he'll be able to get this outpatient - needs PCP as well for outpatient follow up Prone as able, OOB, IS, flutter  COVID-19 Labs  Recent Labs    04/14/20 0508 04/15/20 0510  FERRITIN 578* 691*  CRP 15.9* 9.6*    Lab Results  Component Value Date   SARSCOV2NAA POSITIVE (Chad Cannon) 04/13/2020   Sinus Tachycardia: likely 2/2 covid and PE - continue to monitor with treatment - EKG with sinus tach  Elevated LFT: continue to monitor  Prediabetes  Steroid induced hyperglycemia: BG's reasonable, will d/c SSI and continue to monitor  DVT prophylaxis: heparin Code Status: full  Family Communication: none at bedside Disposition:   Status is: Inpatient  Remains inpatient appropriate because:Inpatient level of care appropriate due to severity of illness   Dispo:  Patient From: Home  Planned Disposition: Home  Expected discharge date: 04/17/2020  Medically stable for discharge: No        Consultants:   vascular  Procedures:  Echo IMPRESSIONS    1. Left ventricular ejection fraction, by estimation, is 55 to 60%. The  left ventricle has normal  function. Left ventricular endocardial border  not optimally defined to evaluate regional wall motion. Left ventricular  diastolic parameters were normal.  2. Right ventricular systolic function is normal. The right ventricular  size  is normal. Tricuspid regurgitation signal is inadequate for assessing  PA pressure.  3. The mitral valve is normal in structure. No evidence of mitral valve  regurgitation. No evidence of mitral stenosis.  4. The aortic valve is tricuspid. Aortic valve regurgitation is not  visualized. No aortic stenosis is present.  5. Mildly dilated pulmonary artery.   Venous US IMPRESSION: Sonogram of the bilateral lower extremities, negative for deep venous thrombosis.  Antimicrobials: Anti-infectives (From admission, onward)   Start     Dose/Rate Route Frequency Ordered Stop   04/13/20 1000  remdesivir 100 mg in sodium chloride 0.9 % 100 mL IVPB       "Followed by" Linked Group Details   100 mg 200 mL/hr over 30 Minutes Intravenous Daily 04/12/20 2245 04/17/20 0959   04/12/20 2300  remdesivir 200 mg in sodium chloride 0.9% 250 mL IVPB       "Followed by" Linked Group Details   200 mg 580 mL/hr over 30 Minutes Intravenous Once 04/12/20 2245 04/13/20 0134     Subjective: No new complaints Feels progressively better today  Objective: Vitals:   04/14/20 2016 04/15/20 0556 04/15/20 0835 04/15/20 1130  BP: 129/84 130/86 128/84 128/78  Pulse: 98 80 88 (!) 108  Resp: 17 20 14 16   Temp: 98 F (36.7 C) 98 F (36.7 C) 98.1 F (36.7 C) 98.1 F (36.7 C)  TempSrc:   Oral Oral  SpO2: 93% 92% 95% 93%  Weight:      Height:       No intake or output data in the 24 hours ending 04/15/20 1500 Filed Weights   04/12/20 2150 04/14/20 1115  Weight: 96.2 kg 90.6 kg    Examination:  General exam: Appears calm and comfortable  Respiratory system: Clear to auscultation. Respiratory effort normal. Cardiovascular system: regular, mildly tachy Gastrointestinal system: Abdomen is nondistended, soft and nontender. Central nervous system: Alert and oriented. No focal neurological deficits. Extremities: no LEE Skin: No rashes, lesions or ulcers Psychiatry: Judgement and insight appear normal. Mood &  affect appropriate.     Data Reviewed: I have personally reviewed following labs and imaging studies  CBC: Recent Labs  Lab 04/12/20 2158 04/13/20 0605 04/14/20 0508 04/15/20 0510  WBC 10.7* 11.8* 14.9* 11.7*  NEUTROABS  --  10.0* 10.8* 9.7*  HGB 14.7 13.6 13.3 13.3  HCT 42.8 40.7 39.3 39.8  MCV 81.8 82.7 83.1 82.4  PLT 522* 472* 476* 476*    Basic Metabolic Panel: Recent Labs  Lab 04/12/20 2158 04/13/20 0605 04/14/20 0508 04/15/20 0510  NA 136 135 138 139  K 3.7 4.5 3.8 4.9  CL 103 103 103 105  CO2 20* 22 25 26   GLUCOSE 128* 129* 113* 127*  BUN 18 17 24* 21*  CREATININE 1.11 0.98 0.88 0.86  CALCIUM 8.8* 8.9 8.8* 9.0  MG  --   --  2.4 2.2  PHOS  --   --  3.3 3.7    GFR: Estimated Creatinine Clearance: 141.1 mL/min (by C-G formula based on SCr of 0.86 mg/dL).  Liver Function Tests: Recent Labs  Lab 04/13/20 0605 04/14/20 0508 04/15/20 0510  AST 24 25 23   ALT 51* 47* 44  ALKPHOS 49 47 45  BILITOT 0.7 0.5 0.5  PROT 7.7 7.3 7.4  ALBUMIN 3.4*  3.3* 3.1*    CBG: Recent Labs  Lab 04/14/20 0729 04/14/20 1624 04/14/20 2037 04/15/20 0835 04/15/20 1130  GLUCAP 97 123* 139* 111* 141*     Recent Results (from the past 240 hour(s))  Resp Panel by RT-PCR (Flu Chad Cannon&B, Covid) Nasopharyngeal Swab     Status: Abnormal   Collection Time: 04/13/20 10:16 PM   Specimen: Nasopharyngeal Swab; Nasopharyngeal(NP) swabs in vial transport medium  Result Value Ref Range Status   SARS Coronavirus 2 by RT PCR POSITIVE (Chad Cannon) NEGATIVE Final    Comment: RESULT CALLED TO, READ BACK BY AND VERIFIED WITHJonny Cannon [facility] RN 2322 04/13/20 HNM (NOTE) SARS-CoV-2 target nucleic acids are DETECTED.  The SARS-CoV-2 RNA is generally detectable in upper respiratory specimens during the acute phase of infection. Positive results are indicative of the presence of the identified virus, but do not rule out bacterial infection or co-infection with other pathogens not detected by the test.  Clinical correlation with patient history and other diagnostic information is necessary to determine patient infection status. The expected result is Negative.  Fact Sheet for Patients: BloggerCourse.com  Fact Sheet for Healthcare Providers: SeriousBroker.it  This test is not yet approved or cleared by the [place] FDA and  has been authorized for detection and/or diagnosis of SARS-CoV-2 by FDA under an Emergency Use Authorization (EUA).  This EUA will remain in effect (meaning this test can b e used) for the duration of  the COVID-19 declaration under Section 564(b)(1) of the Act, 21 U.S.C. section 360bbb-3(b)(1), unless the authorization is terminated or revoked sooner.     Influenza Chad Cannon by PCR NEGATIVE NEGATIVE Final   Influenza B by PCR NEGATIVE NEGATIVE Final    Comment: (NOTE) The Xpert Xpress SARS-CoV-2/FLU/RSV plus assay is intended as an aid in the diagnosis of influenza from Nasopharyngeal swab specimens and should not be used as Chad Cannon sole basis for treatment. Nasal washings and aspirates are unacceptable for Xpert Xpress SARS-CoV-2/FLU/RSV testing.  Fact Sheet for Patients: BloggerCourse.com  Fact Sheet for Healthcare Providers: SeriousBroker.it  This test is not yet approved or cleared by the [place] FDA and has been authorized for detection and/or diagnosis of SARS-CoV-2 by FDA under an Emergency Use Authorization (EUA). This EUA will remain in effect (meaning this test can be used) for the duration of the COVID-19 declaration under Section 564(b)(1) of the Act, 21 U.S.C. section 360bbb-3(b)(1), unless the authorization is terminated or revoked.  Performed at [facility], [address], [place], [place] [address]          Radiology Studies: No results found.      Scheduled Meds: . acetaminophen  650 mg Oral Q6H  . vitamin C  500  mg Oral Daily  . baricitinib  4 mg Oral Daily  . cholecalciferol  1,000 Units Oral Daily  . heparin  3,000 Units Intravenous Once  . insulin aspart  0-9 Units Subcutaneous TID WC  . Ipratropium-Albuterol  1 puff Inhalation Q6H  . methylPREDNISolone (SOLU-MEDROL) injection  1 mg/kg Intravenous Q12H   Followed by  . [START ON 04/16/2020] predniSONE  50 mg Oral Daily  . multivitamin with minerals  1 tablet Oral Daily  . Rivaroxaban  15 mg Oral BID WC   Followed by  . [START ON 05/06/2020] rivaroxaban  20 mg Oral Q supper  . sodium chloride flush  3 mL Intravenous Q12H  . zinc sulfate  220 mg Oral Daily   Continuous Infusions: . remdesivir 100 mg in NS 100  mL 100 mg (04/15/20 0952)     LOS: 3 days    Time spent: over 30 min    Lacretia Nicks, MD [facility]   To contact the attending provider between 7A-7P or the covering provider during after hours 7P-7A, please log into the web site [email or web address] and access using universal [place] password for that web site. If you do not have the password, please call the hospital operator.  04/15/2020, 3:00 PM

## 2020-04-15 NOTE — Progress Notes (Signed)
PT Cancellation Note  Patient Details Name: Chad Cannon MRN: [id number] DOB: April 11, 1991   Cancelled Treatment:    Reason Eval/Treat Not Completed: Other (comment) Spoke with nurse who reports that pt is moving well and that he currently doesn't have any PT needs.  Discussed with attending MD who agrees with completion of PT orders for the 29 y/o patient.    Malachi Pro, DPT 04/15/2020, 11:06 AM

## 2020-04-15 NOTE — TOC Initial Note (Addendum)
Transition of Care [facility]) - Initial/Assessment Note    Patient Details  Name: Chad Cannon MRN: [id number] Date of Birth: 12-30-1990  [facility] CM/SW Contact:    Liliana Cline, LCSW Phone Number: 04/15/2020, 11:32 AM  Clinical Narrative:        CSW spoke with patient via phone due to Airborne Precautions. Patient is uninsured and needs PCP resources. Patient reported he lives with his wife and drives himself where he needs to go. No PCP or pharmacy. CSW explained [facility] and patient is interested in using these resources at discharge. [facility] to [facility] Home pharmacy updated to [facility]. Application left for RN to deliver to patient. Patient denied additional needs at this time.          3:00- CSW reached out to [facility] with [facility] who confirmed they have Xarelto.   Expected Discharge Plan: Home/Self Care Barriers to Discharge: Continued Medical Work up   Patient Goals and CMS Choice Patient states their goals for this hospitalization and ongoing recovery are:: home with self care CMS Medicare.gov Compare Post Acute Care list provided to:: Patient Choice offered to / list presented to : Patient  Expected Discharge Plan and Services Expected Discharge Plan: Home/Self Care       Living arrangements for the past 2 months: Single Family Home                                      Prior Living Arrangements/Services Living arrangements for the past 2 months: Single Family Home Lives with:: Spouse Patient language and need for interpreter reviewed:: Yes Do you feel safe going back to the place where you live?: Yes      Need for Family Participation in Patient Care: Yes (Comment) Care giver support system in place?: Yes (comment)   Criminal Activity/Legal Involvement Pertinent to Current Situation/Hospitalization: No - Comment as needed  Activities of Daily Living Home Assistive  Devices/Equipment: None ADL Screening (condition at time of admission) Patient's cognitive ability adequate to safely complete daily activities?: Yes Is the patient deaf or have difficulty hearing?: No Does the patient have difficulty seeing, even when wearing glasses/contacts?: No Does the patient have difficulty concentrating, remembering, or making decisions?: No Patient able to express need for assistance with ADLs?: Yes Does the patient have difficulty dressing or bathing?: No Independently performs ADLs?: Yes (appropriate for developmental age) Does the patient have difficulty walking or climbing stairs?: No Weakness of Legs: None Weakness of Arms/Hands: None  Permission Sought/Granted Permission sought to share information with : Oceanographer granted to share information with : Yes, Verbal Permission Granted     Permission granted to share info w AGENCY: [facility]        Emotional Assessment       Orientation: : Oriented to Self,Oriented to Place,Oriented to  Time,Oriented to Situation Alcohol / Substance Use: Not Applicable Psych Involvement: No (comment)  Admission diagnosis:  Leg pain [M79.606] Acute pulmonary edema (HCC) [J81.0] Hypoxia [R09.02] Acute pulmonary embolism (HCC) [I26.99] Acute hypoxemic respiratory failure due to COVID-19 (HCC) [U07.1, J96.01] COVID-19 [U07.1] Patient Active Problem List   Diagnosis Date Noted  . COVID-19 04/14/2020  . Acute hypoxemic respiratory failure due to COVID-19 (HCC) 04/12/2020  . Acute pulmonary embolism without acute cor pulmonale (HCC) 04/12/2020   PCP:  Patient, No Pcp Per Pharmacy:  No Pharmacies Listed    Social Determinants of Health (SDOH) Interventions    Readmission Risk Interventions No flowsheet data found.

## 2020-04-15 NOTE — Progress Notes (Addendum)
ANTICOAGULATION CONSULT NOTE  Pharmacy Consult for transition from heparin infusion to oral Xarelto Indication: pulmonary embolus  No Known Allergies  Patient Measurements: Height: 5\' 9"  (175.3 cm) Weight: 90.6 kg (199 lb 11.2 oz) IBW/kg (Calculated) : 70.7 Heparin Dosing Weight: 90.7kg  Vital Signs: Temp: 98 F (36.7 C) (12/16 0556) BP: 130/86 (12/16 0556) Pulse Rate: 80 (12/16 0556)  Labs: Recent Labs    04/12/20 2158 04/12/20 2335 04/13/20 0605 04/13/20 1240 04/13/20 1900 04/14/20 0508 04/15/20 0510  HGB 14.7  --  13.6  --   --  13.3 13.3  HCT 42.8  --  40.7  --   --  39.3 39.8  PLT 522*  --  472*  --   --  476* 476*  APTT  --   --  106*  --   --   --   --   LABPROT  --   --  15.3*  --   --   --   --   INR  --   --  1.3*  --   --   --   --   HEPARINUNFRC  --   --  0.91*   < > 0.52 0.40 <0.10*  CREATININE 1.11  --  0.98  --   --  0.88 0.86  TROPONINIHS 8 5  --   --   --   --   --    < > = values in this interval not displayed.    Estimated Creatinine Clearance: 141.1 mL/min (by C-G formula based on SCr of 0.86 mg/dL).   Goal of Therapy:  Heparin level 0.3-0.7 units/ml Monitor platelets by anticoagulation protocol: Yes   Date Time Level Rate 12/14 0605 0.91 1550 units/hr - supratherapeutic 12/14 1240 0.50 1350 units/hr - therapeutic x1 12/14 1900 0.52 1350 units/hr - therapeutic x 2 12/15  0508 0.40 1350 units/hr - therapeutic x 3 12/16 0510  <0.10 1450 units/hr -  subtherapeutic   Discussed subtherapeutic level with floor nurse, who inspected the bag to assure it was running appropriately - some heparin still in the bag and drip running, but the timeline of when the bag had been hung was suspect for it possibly to have not been running the whole time.  Plan:  Will bolus 3000 units due to subtherapeutic level, and increase cautiously to 1450 units/hr given patient was previously therapeutic on 1350  Recheck anti-Xa level and CBC with AM labs. Continue to  monitor H&H and platelets  Addendum: Pharmacy now consulted to start Xarelto for PE.  Will start Xarelto 15mg  bid x 21 days, followed by Xarelto 20mg  qd - stopping heparin drip with first dose of Xarelto per protocol.  1/17, PharmD, BCPS Clinical Pharmacist 04/15/2020 7:55 AM

## 2020-04-16 ENCOUNTER — Other Ambulatory Visit: Payer: Self-pay | Admitting: Family Medicine

## 2020-04-16 LAB — CBC WITH DIFFERENTIAL/PLATELET
Abs Immature Granulocytes: 0.52 10*3/uL — ABNORMAL HIGH (ref 0.00–0.07)
Basophils Absolute: 0 10*3/uL (ref 0.0–0.1)
Basophils Relative: 0 %
Eosinophils Absolute: 0 10*3/uL (ref 0.0–0.5)
Eosinophils Relative: 0 %
HCT: 39.1 % (ref 39.0–52.0)
Hemoglobin: 13.2 g/dL (ref 13.0–17.0)
Immature Granulocytes: 4 %
Lymphocytes Relative: 12 %
Lymphs Abs: 1.8 10*3/uL (ref 0.7–4.0)
MCH: 28.1 pg (ref 26.0–34.0)
MCHC: 33.8 g/dL (ref 30.0–36.0)
MCV: 83.4 fL (ref 80.0–100.0)
Monocytes Absolute: 0.7 10*3/uL (ref 0.1–1.0)
Monocytes Relative: 5 %
Neutro Abs: 11.2 10*3/uL — ABNORMAL HIGH (ref 1.7–7.7)
Neutrophils Relative %: 79 %
Platelets: 512 10*3/uL — ABNORMAL HIGH (ref 150–400)
RBC: 4.69 MIL/uL (ref 4.22–5.81)
RDW: 13 % (ref 11.5–15.5)
WBC: 14.2 10*3/uL — ABNORMAL HIGH (ref 4.0–10.5)
nRBC: 0 % (ref 0.0–0.2)

## 2020-04-16 LAB — FERRITIN: Ferritin: 706 ng/mL — ABNORMAL HIGH (ref 24–336)

## 2020-04-16 LAB — COMPREHENSIVE METABOLIC PANEL
ALT: 77 U/L — ABNORMAL HIGH (ref 0–44)
AST: 44 U/L — ABNORMAL HIGH (ref 15–41)
Albumin: 3.2 g/dL — ABNORMAL LOW (ref 3.5–5.0)
Alkaline Phosphatase: 49 U/L (ref 38–126)
Anion gap: 13 (ref 5–15)
BUN: 18 mg/dL (ref 6–20)
CO2: 23 mmol/L (ref 22–32)
Calcium: 8.9 mg/dL (ref 8.9–10.3)
Chloride: 102 mmol/L (ref 98–111)
Creatinine, Ser: 0.83 mg/dL (ref 0.61–1.24)
GFR, Estimated: >60 mL/min (ref >60)
Glucose, Bld: 140 mg/dL — ABNORMAL HIGH (ref 70–99)
Potassium: 4.1 mmol/L (ref 3.5–5.1)
Sodium: 138 mmol/L (ref 135–145)
Total Bilirubin: 0.5 mg/dL (ref 0.3–1.2)
Total Protein: 7 g/dL (ref 6.5–8.1)

## 2020-04-16 LAB — PHOSPHORUS: Phosphorus: 3.5 mg/dL (ref 2.5–4.6)

## 2020-04-16 LAB — MAGNESIUM: Magnesium: 2.2 mg/dL (ref 1.7–2.4)

## 2020-04-16 LAB — GLUCOSE, CAPILLARY: Glucose-Capillary: 128 mg/dL — ABNORMAL HIGH (ref 70–99)

## 2020-04-16 LAB — FIBRIN DERIVATIVES D-DIMER (ARMC ONLY): Fibrin derivatives D-dimer (ARMC): 683.6 ng/mL (FEU) — ABNORMAL HIGH (ref 0.00–499.00)

## 2020-04-16 LAB — C-REACTIVE PROTEIN: CRP: 4.6 mg/dL — ABNORMAL HIGH (ref <1.0)

## 2020-04-16 MED ORDER — RIVAROXABAN 20 MG PO TABS: 20.0000 mg | ORAL_TABLET | Freq: Every day | ORAL | 0 refills | Status: DC

## 2020-04-16 MED ORDER — DEXAMETHASONE 6 MG PO TABS: 6.0000 mg | ORAL_TABLET | Freq: Every day | ORAL | 0 refills | Status: AC

## 2020-04-16 MED ORDER — RIVAROXABAN (XARELTO) VTE STARTER PACK (15 & 20 MG): ORAL_TABLET | ORAL | 0 refills | Status: DC

## 2020-04-16 NOTE — TOC Transition Note (Signed)
Transition of Care [facility]) - CM/SW Discharge Note   Patient Details  Name: Chad Cannon MRN: [id number] Date of Birth: 06/19/1990  Transition of Care [facility]) CM/SW Contact:  Liliana Cline, LCSW Phone Number: 04/16/2020, 1:01 PM   Clinical Narrative:   Patient to discharge home today. Referral made to [facility]. TOC Assistant Holly to pick up medications when they are ready and deliver them to 1C prior to patient being discharged. Patient aware he needs to complete application for continued follow up with these agencies. CSW informed RN and MD. No other needs identified prior to DC.    Final next level of care: Home/Self Care Barriers to Discharge: Barriers Resolved   Patient Goals and CMS Choice Patient states their goals for this hospitalization and ongoing recovery are:: home with self care CMS Medicare.gov Compare Post Acute Care list provided to:: Patient Choice offered to / list presented to : Patient  Discharge Placement                  Name of family member notified: patient aware. Patient and family notified of of transfer: 04/16/20  Discharge Plan and Services                                     Social Determinants of Health (SDOH) Interventions     Readmission Risk Interventions No flowsheet data found.

## 2020-04-16 NOTE — Discharge Summary (Signed)
Physician Discharge Summary  Chad Cannon ZDG:[id number] DOB: December 14, 1990 DOA: 04/12/2020  PCP: Patient, No Pcp Per  Admit date: 04/12/2020 Discharge date: 04/16/2020  Time spent: 40 minutes  Recommendations for Outpatient Follow-up:  1. Follow outpatient CBC/CMP 2. [facility]  3. Continue xarelto at least 3 months for PE 2/2 COVID infection, can follow up with PCP to discuss whether to continue after this - given 1st event, with known inciting factor, can likely d/c anticoagulation after 3-6 months 4. Follow LFT's outpatient 5. Prediabetes, follow outpatient   6. Follow tachycardia outpatient, likely 2/2 covid/PE    Discharge Diagnoses:  Principal Problem:   Acute hypoxemic respiratory failure due to COVID-19 [facility]) Active Problems:   Acute pulmonary embolism without acute cor pulmonale (HCC)   COVID-19   Discharge Condition: stable  Diet recommendation: heart healthy  Filed Weights   04/12/20 2150 04/14/20 1115  Weight: 96.2 kg 90.6 kg    History of present illness:  Chad Underwoodis Chad Cannon 29 y.o.malewithoutsignificant medical historywho [facility] worsening shortness of breath and pleuritic chest pain.  Patient states about 2 weeks ago he began feeling generally unwell with fatigue and cough. He had Chad Cannon positive COVID-19 test on 03/31/2020. Since then he has been having progressive symptoms of fevers, chills, diaphoresis, nonproductive cough, body aches/myalgias. He had some shortness of breath intermittently however yesterday developed severe shortness of breath occurring even at rest. He began to have right-sided pleuritic chest pain with inspiration. He has felt as if his heart has been racing. He denies any swelling in his legs or calf pain. Symptoms became so severe he came to the ED for further evaluation.  He has not received any of the COVID-19 vaccinations.  ED Course: Initial vitals showed BP  148/85, pulse 170, RR 35, temp 99.0 F, SPO2 89% on room air. Patient was placed on 4 L supplemental O2 via [place] with SPO2 96%.  Portable chest x-ray showed mild patchy density at the lung bases.  CTA chest PE study showed acute pulmonary embolism without CT evidence of right heart strain. Developing pulmonary infarcts within the right upper and right lower lobes are seen. 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  EDP discussed with on-call vascular surgery, Dr. Wyn Quaker, who recommended starting IV heparin and they will consult in [place]et.m. to discuss potential thrombectomy. Patient was also given IV Decadron 10 mg and started on IV remdesivir. The hospitalist service was consulted to admit for further evaluation and management.  He was admitted with covid 19 infection and PE.  He's improved with anticoagulation and typical treatment for covid.  He wasn't interested in intervention and no clear indication, this was deferred.  Plan for d/c with anticoagulation.  Hospital Course:  Acute Hypoxic Respiratory Failure 2/2 COVID Pneumonia and Acute Pulmonary Embolism Improved today, currently on RA CT with acute PE, developing pulm infarcts within R upper lobe and R lower lobe.  Superimposed parenchymal infiltrates.   Continue steroids, remdesivir, baricitinib Strict I/O, daily weights  Echo with normal RVSF, mildly dilated PA - bilateral LE [place] without DVT D/c with xarelto - needs at least 3 months in setting of provoked by covid, recommended follow up with PCP to discuss at 3 months whether needs longer course Prone as able, OOB, IS, flutter  COVID-19 Labs  Recent Labs    04/14/20 0508 04/15/20 0510 04/16/20 0604  FERRITIN 578* 691* 706*  CRP 15.9* 9.6* 4.6*  Lab Results  Component Value Date   SARSCOV2NAA POSITIVE (Javarie Crisp) 04/13/2020   Sinus Tachycardia: likely 2/2 covid and PE - continue to monitor with treatment - EKG with sinus  tach - improving, follow outpatient   Elevated LFT: continue to monitor - follow outpatient   Prediabetes  Steroid induced hyperglycemia: BG's reasonable, will d/c SSI and continue to monitor - follow outpatient   Procedures: Echo IMPRESSIONS    1. Left ventricular ejection fraction, by estimation, is 55 to 60%. The  left ventricle has normal function. Left ventricular endocardial border  not optimally defined to evaluate regional wall motion. Left ventricular  diastolic parameters were normal.  2. Right ventricular systolic function is normal. The right ventricular  size is normal. Tricuspid regurgitation signal is inadequate for assessing  PA pressure.  3. The mitral valve is normal in structure. No evidence of mitral valve  regurgitation. No evidence of mitral stenosis.  4. The aortic valve is tricuspid. Aortic valve regurgitation is not  visualized. No aortic stenosis is present.  5. Mildly dilated pulmonary artery.   LE US IMPRESSION: Sonogram of the bilateral lower extremities, negative for deep venous thrombosis.   Consultations:  vascular  Discharge Exam: Vitals:   04/16/20 0753 04/16/20 1153  BP: 133/82 126/73  Pulse: 85 82  Resp: 20 16  Temp: 97.6 F (36.4 C) 97.6 F (36.4 C)  SpO2: 96% 96%   Feels better No new complaints  General: No acute distress. Cardiovascular: mild tachycardia Lungs: Clear to auscultation bilaterally with good air movement. No rales, rhonchi or wheezes. Abdomen: Soft, nontender, nondistended  Neurological: Alert and oriented 3. Moves all extremities 4. Cranial nerves II through XII grossly intact. Skin: Warm and dry. No rashes or lesions. Extremities: No clubbing or cyanosis. No edema.  Discharge Instructions   Discharge Instructions    Call MD for:  difficulty breathing, headache or visual disturbances   Complete by: As directed    Call MD for:  extreme fatigue   Complete by: As directed    Call MD for:   hives   Complete by: As directed    Call MD for:  persistant dizziness or light-headedness   Complete by: As directed    Call MD for:  persistant nausea and vomiting   Complete by: As directed    Call MD for:  redness, tenderness, or signs of infection (pain, swelling, redness, odor or green/yellow discharge around incision site)   Complete by: As directed    Call MD for:  severe uncontrolled pain   Complete by: As directed    Call MD for:  temperature >100.4   Complete by: As directed    Diet - low sodium heart healthy   Complete by: As directed    Discharge instructions   Complete by: As directed    You were seen for covid 19 infection and pulmonary embolism (blood clot in your lungs).  You've improved with steroids, baricitinib, and remdesivir.  We'll send you home with Justis Closser few more days of steroids.   You need to take your blood thinner (xarelto) for your blood clot for 3 months uninterrupted.  After 3 months, please have Aaradhya Kysar conversation with your PCP about whether you can discontinue this or whether it needs to be continued for Tyrianna Lightle longer period of time.  You'll take 15 mg of xarelto twice daily for 21 days (you've had 3 doses already), then transition to 20 mg daily for the duration of your treatment.   I prescribed  Corion Sherrod starter pack and Eliceo Gladu refill of xarelto for you.  You'll need at least 1 more refill for 3 months of therapy.  Please follow up with your PCP for this and for additional recommendations regarding further treatment.  It's extremely important that your treatment not be interrupted.  Please make sure you see your PCP and get refills of the xarelto.    You should get repeat labs in [place]arn[place] Perkins few days.  Your liver enzymes are mildly elevated from your covid infection, this needs to be followed outpatient.   You have prediabetes, you'll need to follow this outpatient as well.  Please follow up with your PCP in [place] few days.  Return for new, recurrent, or worsening symptoms.  Please ask  your PCP to request records from this hospitalization so they know what was done and what the next steps will be.   Increase activity slowly   Complete by: As directed      Allergies as of 04/16/2020   No Known Allergies     Medication List    TAKE these medications   dexamethasone 6 MG tablet Commonly known as: DECADRON Take 1 tablet (6 mg total) by mouth daily for 5 days.   Rivaroxaban Stater Pack (15 mg and 20 mg) Commonly known as: XARELTO STARTER PACK Follow package directions: Take one 15mg  tablet by mouth twice Saim Almanza day. On day 22, switch to one 20mg  tablet once Undray Allman day. Take with food.   rivaroxaban 20 MG Tabs tablet Commonly known as: XARELTO Take 1 tablet (20 mg total) by mouth daily with supper. (after you've completed the starter pack) Start taking on: May 15, 2020      No Known Allergies  Follow-up Information    [facility].   Specialty: Primary Care Why: You'll need [facility] and follow up care Contact information: [address] [place] [id number] [phone]               The results of significant diagnostics from this hospitalization (including imaging, microbiology, ancillary and laboratory) are listed below for reference.    Significant Diagnostic Studies: CT Angio Chest PE W and/or Wo Contrast  Result Date: 04/12/2020 CLINICAL DATA:  Right-sided chest pain, COVID pneumonia, progressive dyspnea EXAM: CT ANGIOGRAPHY CHEST WITH CONTRAST TECHNIQUE: Multidetector CT imaging of the chest was performed using the standard protocol during bolus administration of intravenous contrast. Multiplanar CT image reconstructions and MIPs were obtained to evaluate the vascular anatomy. CONTRAST:  75mL OMNIPAQUE IOHEXOL 350 MG/ML SOLN COMPARISON:  None. FINDINGS: Cardiovascular: The pulmonary arterial tree is suboptimally opacified, likely related to  respiratory artifact. However, despite this, there is intraluminal filling defect identified within the right upper and lower lobar pulmonary arteries in keeping with acute pulmonary embolism. The central pulmonary arteries are not enlarged. No significant coronary artery calcification. No CT evidence of right heart strain. Global cardiac size within normal limits. No pericardial effusion. Thoracic aorta is unremarkable. Mediastinum/Nodes: No enlarged mediastinal, hilar, or axillary lymph nodes. Thyroid gland, trachea, and esophagus demonstrate no significant findings. 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 There is, however, superimposed consolidation and ground-glass infiltrate within the basilar right lower lobe and posterior segment of the right upper lobe peripherally most in keeping with acute pulmonary infarcts. Tiny right pleural  effusion. No pneumothorax. No central obstructing lesion. Upper Abdomen: No acute abnormality. Musculoskeletal: No chest wall abnormality. No acute or significant osseous findings. Review of the MIP images confirms the above findings. IMPRESSION: Acute pulmonary embolism. Suboptimal imaging limits evaluation of the embolic burden, however, there is no CT evidence of right heart strain. 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 Mild parenchymal involvement in this respect. Attempts are being made at this time to contact the ordering physician for direct communication. Electronically Signed   By: Helyn Numbers MD   On: 04/12/2020 23:25   US Venous Img Lower Bilateral (DVT)  Result Date: 04/13/2020 CLINICAL DATA:  Leg pain, history of prior pulmonary emboli shows in this 29 year old  male EXAM: Bilateral LOWER EXTREMITY VENOUS DOPPLER ULTRASOUND TECHNIQUE: Gray-scale sonography with compression, as well as color and duplex ultrasound, were performed to evaluate the deep venous system(s) from the level of the common femoral vein through the popliteal and proximal calf veins. COMPARISON:  None FINDINGS: VENOUS Normal compressibility of the common femoral, superficial femoral, and popliteal veins, as well as the visualized calf veins. Visualized portions of profunda femoral vein and great saphenous vein unremarkable. No filling defects to suggest DVT on grayscale or color Doppler imaging. Doppler waveforms show normal direction of venous flow, normal respiratory plasticity and response to augmentation. OTHER Flow related artifact in the lower extremity veins bilaterally compatible with rouleaux flow, no sign of thrombus. Limitations: none IMPRESSION: Sonogram of the bilateral lower extremities, negative for deep venous thrombosis. Electronically Signed   By: Donzetta Kohut M.D.   On: 04/13/2020 11:33   DG Chest Portable 1 View  Result Date: 04/12/2020 CLINICAL DATA:  Shortness of breath, positive COVID test 12/2 EXAM: PORTABLE CHEST 1 VIEW COMPARISON:  None. FINDINGS: Low lung volumes secondary to poor inspiration. Mild patchy density at the lung bases. No pleural effusion or pneumothorax. Cardiomediastinal contours are within normal limits. IMPRESSION: Mild patchy density at the lung bases, which may reflect atelectasis or atypical pneumonia in the appropriate setting. Electronically Signed   By: Guadlupe Spanish M.D.   On: 04/12/2020 21:52   ECHOCARDIOGRAM COMPLETE  Result Date: 04/13/2020    ECHOCARDIOGRAM REPORT   Patient Name:   MANOLO BOSKET Date of Exam: 04/13/2020 Medical Rec #:  [id number]         Height:       69.0 in Accession #:    [id number]        Weight:       212.0 lb Date of Birth:  [birth year]          BSA:          2.118 m Patient Age:    29 years          BP:            129/74 mmHg Patient Gender: M                 HR:           100 bpm. Exam Location:  [facility] Procedure: 2D Echo, Color Doppler and Cardiac Doppler Indications:     I26.99 Pulmonary Embolus  History:         Patient has no prior history of Echocardiogram examinations.                  Signs/Symptoms:Shortness of Breath. Pt tested positive for  COVID-19 on 03/31/20.  Sonographer:     Humphrey Rolls RDCS (AE) Referring Phys:  [id number] Floreen Comber PATEL Diagnosing Phys: Yvonne Kendall MD  Sonographer Comments: Suboptimal subcostal window. IMPRESSIONS  1. Left ventricular ejection fraction, by estimation, is 55 to 60%. The left ventricle has normal function. Left ventricular endocardial border not optimally defined to evaluate regional wall motion. Left ventricular diastolic parameters were normal.  2. Right ventricular systolic function is normal. The right ventricular size is normal. Tricuspid regurgitation signal is inadequate for assessing PA pressure.  3. The mitral valve is normal in structure. No evidence of mitral valve regurgitation. No evidence of mitral stenosis.  4. The aortic valve is tricuspid. Aortic valve regurgitation is not visualized. No aortic stenosis is present.  5. Mildly dilated pulmonary artery. FINDINGS  Left Ventricle: Left ventricular ejection fraction, by estimation, is 55 to 60%. The left ventricle has normal function. Left ventricular endocardial border not optimally defined to evaluate regional wall motion. The left ventricular internal cavity size was normal in size. There is no left ventricular hypertrophy. Left ventricular diastolic parameters were normal. Right Ventricle: The right ventricular size is normal. No increase in right ventricular wall thickness. Right ventricular systolic function is normal. Tricuspid regurgitation signal is inadequate for assessing PA pressure. Left Atrium: Left atrial size was normal in size. Right Atrium: Right atrial size was normal in size.  Pericardium: Trivial pericardial effusion is present. Mitral Valve: The mitral valve is normal in structure. No evidence of mitral valve regurgitation. No evidence of mitral valve stenosis. MV peak gradient, 2.1 mmHg. The mean mitral valve gradient is 1.0 mmHg. Tricuspid Valve: The tricuspid valve is not well visualized. Tricuspid valve regurgitation is not demonstrated. Aortic Valve: The aortic valve is tricuspid. Aortic valve regurgitation is not visualized. No aortic stenosis is present. Aortic valve mean gradient measures 1.0 mmHg. Aortic valve peak gradient measures 2.4 mmHg. Aortic valve area, by VTI measures 4.52 cm. Pulmonic Valve: The pulmonic valve was normal in structure. Pulmonic valve regurgitation is trivial. No evidence of pulmonic stenosis. Aorta: The aortic root is normal in size and structure. Pulmonary Artery: The pulmonary artery is mildly dilated. Venous: The inferior vena cava was not well visualized. IAS/Shunts: The interatrial septum was not well visualized.  LEFT VENTRICLE PLAX 2D LVIDd:         4.22 cm  Diastology LVIDs:         3.11 cm  LV e' medial:    9.79 cm/s LV PW:         1.03 cm  LV E/e' medial:  6.7 LV IVS:        0.87 cm  LV e' lateral:   11.00 cm/s LVOT diam:     2.40 cm  LV E/e' lateral: 6.0 LV SV:         50 LV SV Index:   24 LVOT Area:     4.52 cm  LEFT ATRIUM         Index      RIGHT ATRIUM           Index LA diam:    2.80 cm 1.32 cm/m RA Area:     12.54 cm 5.92 cm/m  AORTIC VALVE                   PULMONIC VALVE AV Area (Vmax):    3.79 cm    PV Vmax:       0.78 m/s AV Area (Vmean):   3.95 cm  PV Vmean:      55.900 cm/s AV Area (VTI):     4.52 cm    PV VTI:        0.117 m AV Vmax:           76.80 cm/s  PV Peak grad:  2.4 mmHg AV Vmean:          50.100 cm/s PV Mean grad:  1.0 mmHg AV VTI:            0.111 m AV Peak Grad:      2.4 mmHg AV Mean Grad:      1.0 mmHg LVOT Vmax:         64.30 cm/s LVOT Vmean:        43.700 cm/s LVOT VTI:          0.111 m LVOT/AV VTI  ratio: 1.00  AORTA Ao Root diam: 3.50 cm MITRAL VALVE MV Area (PHT): 7.16 cm    SHUNTS MV Peak grad:  2.1 mmHg    Systemic VTI:  0.11 m MV Mean grad:  1.0 mmHg    Systemic Diam: 2.40 cm MV Vmax:       0.72 m/s MV Vmean:      54.5 cm/s MV Decel Time: 106 msec MV E velocity: 66.00 cm/s MV Nilo Fallin velocity: 74.20 cm/s MV E/Winry Egnew ratio:  0.89 Christopher End MD Electronically signed by Yvonne Kendall MD Signature Date/Time: 04/13/2020/2:30:09 PM    Final     Microbiology: Recent Results (from the past 240 hour(s))  Resp Panel by RT-PCR (Flu Yasiel Goyne&B, Covid) Nasopharyngeal Swab     Status: Abnormal   Collection Time: 04/13/20 10:16 PM   Specimen: Nasopharyngeal Swab; Nasopharyngeal(NP) swabs in vial transport medium  Result Value Ref Range Status   SARS Coronavirus 2 by RT PCR POSITIVE (Loreen Bankson) NEGATIVE Final    Comment: RESULT CALLED TO, READ BACK BY AND VERIFIED WITHJonny Ruiz [facility] RN 2322 04/13/20 HNM (NOTE) SARS-CoV-2 target nucleic acids are DETECTED.  The SARS-CoV-2 RNA is generally detectable in upper respiratory specimens during the acute phase of infection. Positive results are indicative of the presence of the identified virus, but do not rule out bacterial infection or co-infection with other pathogens not detected by the test. Clinical correlation with patient history and other diagnostic information is necessary to determine patient infection status. The expected result is Negative.  Fact Sheet for Patients: BloggerCourse.com  Fact Sheet for Healthcare Providers: SeriousBroker.it  This test is not yet approved or cleared by the [place] FDA and  has been authorized for detection and/or diagnosis of SARS-CoV-2 by FDA under an Emergency Use Authorization (EUA).  This EUA will remain in effect (meaning this test can b e used) for the duration of  the COVID-19 declaration under Section 564(b)(1) of the Act, 21 U.S.C. section 360bbb-3(b)(1),  unless the authorization is terminated or revoked sooner.     Influenza Nakoa Ganus by PCR NEGATIVE NEGATIVE Final   Influenza B by PCR NEGATIVE NEGATIVE Final    Comment: (NOTE) The Xpert Xpress SARS-CoV-2/FLU/RSV plus assay is intended as an aid in the diagnosis of influenza from Nasopharyngeal swab specimens and should not be used as Mariaisabel Bodiford sole basis for treatment. Nasal washings and aspirates are unacceptable for Xpert Xpress SARS-CoV-2/FLU/RSV testing.  Fact Sheet for Patients: BloggerCourse.com  Fact Sheet for Healthcare Providers: SeriousBroker.it  This test is not yet approved or cleared by the [place] FDA and has been authorized for detection and/or diagnosis of SARS-CoV-2 by FDA under an  Emergency Use Authorization (EUA). This EUA will remain in effect (meaning this test can be used) for the duration of the COVID-19 declaration under Section 564(b)(1) of the Act, 21 U.S.C. section 360bbb-3(b)(1), unless the authorization is terminated or revoked.  Performed at [facility], [address] [place], [place] [address]      Labs: Basic Metabolic Panel: Recent Labs  Lab 04/12/20 2158 04/13/20 0605 04/14/20 0508 04/15/20 0510 04/16/20 0604  NA 136 135 138 139 138  K 3.7 4.5 3.8 4.9 4.1  CL 103 103 103 105 102  CO2 20* 22 25 26 23   GLUCOSE 128* 129* 113* 127* 140*  BUN 18 17 24* 21* 18  CREATININE 1.11 0.98 0.88 0.86 0.83  CALCIUM 8.8* 8.9 8.8* 9.0 8.9  MG  --   --  2.4 2.2 2.2  PHOS  --   --  3.3 3.7 3.5   Liver Function Tests: Recent Labs  Lab 04/13/20 0605 04/14/20 0508 04/15/20 0510 04/16/20 0604  AST 24 25 23  44*  ALT 51* 47* 44 77*  ALKPHOS 49 47 45 49  BILITOT 0.7 0.5 0.5 0.5  PROT 7.7 7.3 7.4 7.0  ALBUMIN 3.4* 3.3* 3.1* 3.2*   No results for input(s): LIPASE, AMYLASE in the last 168 hours. No results for input(s): AMMONIA in the last 168 hours. CBC: Recent Labs  Lab 04/12/20 2158  04/13/20 0605 04/14/20 0508 04/15/20 0510 04/16/20 0604  WBC 10.7* 11.8* 14.9* 11.7* 14.2*  NEUTROABS  --  10.0* 10.8* 9.7* 11.2*  HGB 14.7 13.6 13.3 13.3 13.2  HCT 42.8 40.7 39.3 39.8 39.1  MCV 81.8 82.7 83.1 82.4 83.4  PLT 522* 472* 476* 476* 512*   Cardiac Enzymes: No results for input(s): CKTOTAL, CKMB, CKMBINDEX, TROPONINI in the last 168 hours. BNP: BNP (last 3 results) No results for input(s): BNP in the last 8760 hours.  ProBNP (last 3 results) No results for input(s): PROBNP in the last 8760 hours.  CBG: Recent Labs  Lab 04/14/20 1624 04/14/20 2037 04/15/20 0835 04/15/20 1130 04/16/20 0753  GLUCAP 123* 139* 111* 141* 128*       Signed:  04/17/20 MD.  [facility] 04/16/2020, 1:08 PM

## 2020-04-16 NOTE — Progress Notes (Addendum)
ANTICOAGULATION CONSULT NOTE  Pharmacy Consult for  oral Xarelto Indication: pulmonary embolus  No Known Allergies  Patient Measurements: Height: 5\' 9"  (175.3 cm) Weight: 90.6 kg (199 lb 11.2 oz) IBW/kg (Calculated) : 70.7  Vital Signs: Temp: 97.6 F (36.4 C) (12/17 0753) Temp Source: Oral (12/17 0753) BP: 133/82 (12/17 0753) Pulse Rate: 85 (12/17 0753)  Labs: Recent Labs    04/13/20 1900 04/14/20 0508 04/14/20 0508 04/15/20 0510 04/16/20 0604  HGB  --  13.3   < > 13.3 13.2  HCT  --  39.3  --  39.8 39.1  PLT  --  476*  --  476* 512*  HEPARINUNFRC 0.52 0.40  --  <0.10*  --   CREATININE  --  0.88  --  0.86 0.83   < > = values in this interval not displayed.    Estimated Creatinine Clearance: 146.2 mL/min (by C-G formula based on SCr of 0.83 mg/dL).   Goal of Therapy:  Monitor platelets by anticoagulation protocol: Yes   Continue Xarelto 15mg  bid x 21 days, followed by Xarelto 20mg  qd  CBC/SCr a minimum of every 3 days per protocol  Patient counseled on phone using room line  04/18/20, PharmD, BCPS Clinical Pharmacist 04/16/2020 9:50 AM

## 2020-05-25 ENCOUNTER — Ambulatory Visit: Payer: Self-pay | Admitting: Pharmacy Technician

## 2020-05-25 DIAGNOSIS — Z79899 Other long term (current) drug therapy: Secondary | ICD-10-CM

## 2020-05-28 ENCOUNTER — Other Ambulatory Visit: Payer: Self-pay

## 2020-05-28 NOTE — Progress Notes (Signed)
Completed [facility] application and contract.  Patient agreed to all terms of the [facility] contract.    Patient still needs to provide 2021 Federal Tax Return.  Patient understands that failure to provide this documentation by 08/29/20 will cause a halt in medication assistance .    Provided patient with Community education officer based on his particular needs.    Sherilyn Dacosta Care Manager [facility]

## 2020-05-31 ENCOUNTER — Ambulatory Visit: Payer: Self-pay | Admitting: Pharmacist

## 2020-05-31 ENCOUNTER — Other Ambulatory Visit: Payer: Self-pay

## 2020-05-31 DIAGNOSIS — Z79899 Other long term (current) drug therapy: Secondary | ICD-10-CM

## 2020-05-31 NOTE — Progress Notes (Signed)
  Medication Management Clinic Visit Note  Patient: Chad Cannon MRN: [id number] Date of Birth: [birth year] PCP: Patient, No Pcp Per   Hali Marry 30 y.o. male presents for a telephone visit for medication management today. Verified patient with two identifiers.  There were no vitals taken for this visit.  Patient Information   Past Medical History:  Diagnosis Date  . Pulmonary embolism (HCC) 03/2020     History reviewed. No pertinent surgical history.  History reviewed. No pertinent family history.  New Diagnoses (since last visit): PE  Family Support: Good  Lifestyle Diet: Breakfast: bagel, yogurt, egg Lunch: burger and fries Dinner: steak and veggies  Drinks: water    Current Exercise Habits: Home exercise routine, Type of exercise: strength training/weights;walking, Frequency (Times/Week): 1, Intensity: Mild       Social History   Substance and Sexual Activity  Alcohol Use Never   Comment: socially      Social History   Tobacco Use  Smoking Status Never Smoker  Smokeless Tobacco Never Used      Health Maintenance  Topic Date Due  . Hepatitis C Screening  Never done  . COVID-19 Vaccine (1) Never done  . TETANUS/TDAP  Never done  . INFLUENZA VACCINE  Never done  . HIV Screening  Completed   Health Maintenance/Date Completed  Last ED visit: 04/12/20 Last Visit to PCP:  Next Visit to PCP:  Specialist Visit: N/A Dental Exam: "been a while" Eye Exam: "been a while" Flu Vaccine: never done Pneumonia Vaccine: never done COVID-19 Vaccine: never done  Outpatient Encounter Medications as of 05/31/2020  Medication Sig  . rivaroxaban (XARELTO) 20 MG TABS tablet Take 1 tablet (20 mg total) by mouth daily with supper. (after you've completed the starter pack)  . RIVAROXABAN (XARELTO) VTE STARTER PACK (15 & 20 MG) Follow package directions: Take one 15mg  tablet by mouth twice a day. On day 22, switch to one 20mg  tablet once a day. Take with food.  (Patient not taking: Reported on 05/31/2020)   No facility-administered encounter medications on file as of 05/31/2020.     Assessment and Plan: Recent pulmonary embolism Pt recently had a PE for which he went to the ED on 04/12/20. He was given rivaroxaban and has completed the initial 21 day starter and is now on the 20mg  once daily regimen. Educated pt on signs and symptoms to be aware of for DVT and PE. Also educated pt on signs/symptoms of bleeding and bruising and when to seek medical attention. Advised pt to follow up with [facility] prior to running out of rivaroxaban in case he may need a refill. Pt stated understanding and will call [facility] when he has (at the latest) 12 days of rivaroxaban to avoid going without the medication in case it is continued. Continue current regimen.   Access/Adherence Pt sets a reminder on his phone for 3pm to remember to take his medication. Pt spoke with last week to get set up with [facility] for follow up.    [facility], PharmD Pharmacy Resident  05/31/2020 11:31 AM

## 2020-06-16 ENCOUNTER — Encounter: Payer: Self-pay | Admitting: Gerontology

## 2020-06-16 ENCOUNTER — Other Ambulatory Visit: Payer: Self-pay | Admitting: Gerontology

## 2020-06-16 ENCOUNTER — Other Ambulatory Visit: Payer: Self-pay

## 2020-06-16 ENCOUNTER — Ambulatory Visit: Payer: Self-pay | Admitting: Gerontology

## 2020-06-16 VITALS — BP 134/84 | HR 90 | Temp 97.2°F | Resp 16 | Wt 228.6 lb

## 2020-06-16 DIAGNOSIS — Z7689 Persons encountering health services in other specified circumstances: Secondary | ICD-10-CM | POA: Insufficient documentation

## 2020-06-16 DIAGNOSIS — I2699 Other pulmonary embolism without acute cor pulmonale: Secondary | ICD-10-CM

## 2020-06-16 MED ORDER — RIVAROXABAN 20 MG PO TABS: 20.0000 mg | ORAL_TABLET | Freq: Every day | ORAL | 5 refills | Status: DC

## 2020-06-16 NOTE — Progress Notes (Signed)
New Patient Office Visit  Subjective:  Patient ID: Chad Cannon, male    DOB: 06-06-1990  Age: 30 y.o. MRN: [id number]  CC: No chief complaint on file.   HPI Chad Cannon a 30 y/o male who presents to establish care. He was discharged from the hospital on 04/30/2020. During his hospital course, he was treated for Pulmonary Embolism secondary to Covid infection with Xarelto. He started 20 mg of Xarelto on 05/15/20 and to continue for 3-6 months. He denies chest pain, palpitation, shortness of breath, hematuria nor hematochezia. Overall, he states that he's doing well and offers no further complaint.  Past Medical History:  Diagnosis Date  . Pulmonary embolism ([place]) 03/2020    No past surgical history on file.  No family history on file.  Social History   Socioeconomic History  . Marital status: Married    Spouse name: Not on file  . Number of children: Not on file  . Years of education: Not on file  . Highest education level: Not on file  Occupational History  . Not on file  Tobacco Use  . Smoking status: Never Smoker  . Smokeless tobacco: Never Used  Substance and Sexual Activity  . Alcohol use: Never    Comment: socially  . Drug use: Never  . Sexual activity: Not Currently  Other Topics Concern  . Not on file  Social History Narrative  . Not on file   Social Determinants of Health   Financial Resource Strain: Not on file  Food Insecurity: Not on file  Transportation Needs: Not on file  Physical Activity: Not on file  Stress: Not on file  Social Connections: Not on file  Intimate Partner Violence: Not on file    ROS Review of Systems  Constitutional: Negative.   HENT: Negative.   Eyes: Negative.   Respiratory: Negative.   Cardiovascular: Negative.   Gastrointestinal: Negative.   Endocrine: Negative.   Genitourinary: Negative.   Musculoskeletal: Negative.   Skin: Negative.   Neurological: Negative.   Hematological: Negative.    Psychiatric/Behavioral: Negative.     Objective:   Today's Vitals: BP 134/84 (BP Location: Left Arm, Patient Position: Sitting, Cuff Size: Large)   Pulse 90   Temp (!) 97.2 F (36.2 C)   Resp 16   Wt 228 lb 9.6 oz (103.7 kg)   SpO2 97%   BMI 33.76 kg/m   Physical Exam HENT:     Head: Normocephalic and atraumatic.     Nose: Nose normal.  Eyes:     Extraocular Movements: Extraocular movements intact.     Conjunctiva/sclera: Conjunctivae normal.     Pupils: Pupils are equal, round, and reactive to light.  Cardiovascular:     Rate and Rhythm: Normal rate and regular rhythm.     Pulses: Normal pulses.     Heart sounds: Normal heart sounds.  Pulmonary:     Effort: Pulmonary effort is normal.     Breath sounds: Normal breath sounds.  Abdominal:     General: Abdomen is flat. Bowel sounds are normal.     Palpations: Abdomen is soft.  Genitourinary:    Comments: Deferred per patient Musculoskeletal:        General: Normal range of motion.     Cervical back: Normal range of motion.  Skin:    General: Skin is warm and dry.  Neurological:     General: No focal deficit present.     Mental Status: He is alert and oriented to person, place,  and time. Mental status is at baseline.  Psychiatric:        Mood and Affect: Mood normal.        Behavior: Behavior normal.        Thought Content: Thought content normal.        Judgment: Judgment normal.     Assessment & Plan:   1. Acute pulmonary embolism without acute cor pulmonale, unspecified pulmonary embolism type ([place]) - He will continue on Xarelto 20 mg daily, was advised to notify clinic or go to the ED for hematuria, hematochezia or active bleeding. - rivaroxaban (XARELTO) 20 MG TABS tablet; Take 1 tablet (20 mg total) by mouth daily with supper. (after you've completed the starter pack)  Dispense: 30 tablet; Refill: 5  2. Encounter to establish care - Routine labs will be checked. - CBC w/Diff; Future - Comp Met (CMET);  Future - Lipid panel; Future - Urinalysis; Future - HgB A1c; Future     Follow-up: Return in about 8 weeks (around 08/11/2020), or if symptoms worsen or fail to improve.   Olisa Quesnel Jerold Coombe, NP

## 2020-06-17 LAB — COMPREHENSIVE METABOLIC PANEL
ALT: 39 IU/L (ref 0–44)
AST: 29 IU/L (ref 0–40)
Albumin/Globulin Ratio: 2.2 (ref 1.2–2.2)
Albumin: 4.7 g/dL (ref 4.1–5.2)
Alkaline Phosphatase: 63 IU/L (ref 44–121)
BUN/Creatinine Ratio: 15 (ref 9–20)
BUN: 15 mg/dL (ref 6–20)
Bilirubin Total: <0.2 mg/dL (ref 0.0–1.2)
CO2: 23 mmol/L (ref 20–29)
Calcium: 9.6 mg/dL (ref 8.7–10.2)
Chloride: 102 mmol/L (ref 96–106)
Creatinine, Ser: 0.97 mg/dL (ref 0.76–1.27)
GFR calc Af Amer: 121 mL/min/{1.73_m2} (ref >59)
GFR calc non Af Amer: 105 mL/min/{1.73_m2} (ref >59)
Globulin, Total: 2.1 g/dL (ref 1.5–4.5)
Glucose: 84 mg/dL (ref 65–99)
Potassium: 3.9 mmol/L (ref 3.5–5.2)
Sodium: 142 mmol/L (ref 134–144)
Total Protein: 6.8 g/dL (ref 6.0–8.5)

## 2020-06-17 LAB — LIPID PANEL
Chol/HDL Ratio: 3 ratio (ref 0.0–5.0)
Cholesterol, Total: 152 mg/dL (ref 100–199)
HDL: 50 mg/dL (ref >39)
LDL Chol Calc (NIH): 62 mg/dL (ref 0–99)
Triglycerides: 249 mg/dL — ABNORMAL HIGH (ref 0–149)
VLDL Cholesterol Cal: 40 mg/dL (ref 5–40)

## 2020-06-17 LAB — URINALYSIS
Bilirubin, UA: NEGATIVE
Glucose, UA: NEGATIVE
Ketones, UA: NEGATIVE
Leukocytes,UA: NEGATIVE
Nitrite, UA: NEGATIVE
RBC, UA: NEGATIVE
Specific Gravity, UA: 1.021 (ref 1.005–1.030)
Urobilinogen, Ur: 0.2 mg/dL (ref 0.2–1.0)
pH, UA: 6 (ref 5.0–7.5)

## 2020-06-17 LAB — CBC WITH DIFFERENTIAL/PLATELET
Basophils Absolute: 0 10*3/uL (ref 0.0–0.2)
Basos: 0 %
EOS (ABSOLUTE): 0.2 10*3/uL (ref 0.0–0.4)
Eos: 3 %
Hematocrit: 41.8 % (ref 37.5–51.0)
Hemoglobin: 14.4 g/dL (ref 13.0–17.7)
Immature Grans (Abs): 0 10*3/uL (ref 0.0–0.1)
Immature Granulocytes: 1 %
Lymphocytes Absolute: 2.4 10*3/uL (ref 0.7–3.1)
Lymphs: 40 %
MCH: 28.5 pg (ref 26.6–33.0)
MCHC: 34.4 g/dL (ref 31.5–35.7)
MCV: 83 fL (ref 79–97)
Monocytes Absolute: 0.5 10*3/uL (ref 0.1–0.9)
Monocytes: 9 %
Neutrophils Absolute: 2.9 10*3/uL (ref 1.4–7.0)
Neutrophils: 47 %
Platelets: 345 10*3/uL (ref 150–450)
RBC: 5.06 x10E6/uL (ref 4.14–5.80)
RDW: 13.9 % (ref 11.6–15.4)
WBC: 6 10*3/uL (ref 3.4–10.8)

## 2020-06-17 LAB — HEMOGLOBIN A1C
Est. average glucose Bld gHb Est-mCnc: 108 mg/dL
Hgb A1c MFr Bld: 5.4 % (ref 4.8–5.6)

## 2020-08-12 ENCOUNTER — Other Ambulatory Visit: Payer: Self-pay

## 2020-08-12 ENCOUNTER — Ambulatory Visit: Payer: Self-pay | Admitting: Gerontology

## 2020-08-12 VITALS — BP 121/82 | HR 91 | Temp 97.9°F | Resp 18 | Wt 233.9 lb

## 2020-08-12 DIAGNOSIS — E781 Pure hyperglyceridemia: Secondary | ICD-10-CM | POA: Insufficient documentation

## 2020-08-12 DIAGNOSIS — I2699 Other pulmonary embolism without acute cor pulmonale: Secondary | ICD-10-CM

## 2020-08-12 NOTE — Progress Notes (Signed)
Established Patient Office Visit  Subjective:  Patient ID: Chad Cannon, male    DOB: 1990/09/17  Age: 30 y.o. MRN: [id number]  CC: No chief complaint on file.   HPI Chad Cannon is a 30 y/o male with a history of pulmonary embolism secondary to Covid infection and takes Xarelto who presents for follow up and lab review. He denies any chest pain, shortness of breath, palpitation,hematuria, hematochezia nor active bleeding. He states that he has been compliant with his medications. Triglycerides done on 06/16/20 was 249 mg/dl. The rest of his labs were unremarkable. Overall, he states that he is doing well and offers no further complaints.  Past Medical History:  Diagnosis Date  . Pulmonary embolism (HCC) 03/2020    No past surgical history on file.  No family history on file.  Social History   Socioeconomic History  . Marital status: Married    Spouse name: Not on file  . Number of children: Not on file  . Years of education: Not on file  . Highest education level: Not on file  Occupational History  . Not on file  Tobacco Use  . Smoking status: Never Smoker  . Smokeless tobacco: Never Used  Substance and Sexual Activity  . Alcohol use: Never    Comment: socially  . Drug use: Never  . Sexual activity: Not Currently  Other Topics Concern  . Not on file  Social History Narrative  . Not on file   Social Determinants of Health   Financial Resource Strain: Not on file  Food Insecurity: Not on file  Transportation Needs: Not on file  Physical Activity: Not on file  Stress: Not on file  Social Connections: Not on file  Intimate Partner Violence: Not on file    Outpatient Medications Prior to Visit  Medication Sig Dispense Refill  . rivaroxaban (XARELTO) 20 MG TABS tablet Take 1 tablet (20 mg total) by mouth daily with supper. (after you've completed the starter pack) 30 tablet 5   No facility-administered medications prior to visit.    No Known  Allergies  ROS Review of Systems  Constitutional: Negative.   HENT: Negative.   Respiratory: Negative.   Cardiovascular: Negative.   Gastrointestinal: Negative.   Endocrine: Negative.   Genitourinary: Negative.   Musculoskeletal: Negative.   Skin: Negative.   Allergic/Immunologic: Negative.   Neurological: Negative.   Hematological: Negative.   Psychiatric/Behavioral: Negative.       Objective:    Physical Exam Constitutional:      Appearance: Normal appearance.  Cardiovascular:     Rate and Rhythm: Normal rate and regular rhythm.     Pulses: Normal pulses.     Heart sounds: Normal heart sounds.  Pulmonary:     Effort: Pulmonary effort is normal.     Breath sounds: Normal breath sounds.  Abdominal:     General: Abdomen is flat. Bowel sounds are normal.  Musculoskeletal:        General: Normal range of motion.     Cervical back: Normal range of motion.  Skin:    General: Skin is warm and dry.     Capillary Refill: Capillary refill takes less than 2 seconds.  Neurological:     Mental Status: He is alert and oriented to person, place, and time. Mental status is at baseline.  Psychiatric:        Mood and Affect: Mood normal.     BP 121/82 (BP Location: Left Arm, Patient Position: Sitting, Cuff Size: Large)  Pulse 91   Temp 97.9 F (36.6 C) (Temporal)   Resp 18   Wt 233 lb 14.4 oz (106.1 kg)   SpO2 95%   BMI 34.54 kg/m  Wt Readings from Last 3 Encounters:  08/12/20 233 lb 14.4 oz (106.1 kg)  06/16/20 228 lb 9.6 oz (103.7 kg)  04/14/20 199 lb 11.2 oz (90.6 kg)   Weight loss encouraged  Health Maintenance Due  Topic Date Due  . Hepatitis C Screening  Never done  . COVID-19 Vaccine (1) Never done  . TETANUS/TDAP  Never done    There are no preventive care reminders to display for this patient.  No results found for: TSH Lab Results  Component Value Date   WBC 6.0 06/16/2020   HGB 14.4 06/16/2020   HCT 41.8 06/16/2020   MCV 83 06/16/2020   PLT  345 06/16/2020   Lab Results  Component Value Date   NA 142 06/16/2020   K 3.9 06/16/2020   CO2 23 06/16/2020   GLUCOSE 84 06/16/2020   BUN 15 06/16/2020   CREATININE 0.97 06/16/2020   BILITOT <0.2 06/16/2020   ALKPHOS 63 06/16/2020   AST 29 06/16/2020   ALT 39 06/16/2020   PROT 6.8 06/16/2020   ALBUMIN 4.7 06/16/2020   CALCIUM 9.6 06/16/2020   ANIONGAP 13 04/16/2020   Lab Results  Component Value Date   CHOL 152 06/16/2020   Lab Results  Component Value Date   HDL 50 06/16/2020   Lab Results  Component Value Date   LDLCALC 62 06/16/2020   Lab Results  Component Value Date   TRIG 249 (H) 06/16/2020   Lab Results  Component Value Date   CHOLHDL 3.0 06/16/2020   Lab Results  Component Value Date   HGBA1C 5.4 06/16/2020      Assessment & Plan:   1. High triglycerides He was advised to adhere to low fat diet and continue with exercise as tolerated  2. Acute pulmonary embolism without acute cor pulmonale, unspecified pulmonary embolism type [facility]) He will continue on Xarelto 20 mg daily and to follow up with Hematology. Bleeding precaution reinforced. He was advised to notify clinic or for to the ED for active bleeding. - Ambulatory referral to Hematology / Oncology    Follow-up: Return in about 3 months (around 11/11/2020), or if symptoms worsen or fail to improve.    Clance Boll, RN

## 2020-08-12 NOTE — Patient Instructions (Signed)
High Triglycerides Eating Plan  Triglycerides are a type of fat in the blood. High levels of triglycerides can increase your risk of heart disease and stroke. If your triglyceride levels are high, choosing the right foods can help lower your triglycerides and keep your heart healthy. Work with your health care provider or a diet and nutrition specialist (dietitian) to develop an eating plan that is right for you.  What are tips for following this plan?  General guidelines    · Lose weight, if you are overweight. For most people, losing 5-10 lbs (2-5 kg) helps lower triglyceride levels. A weight-loss plan may include.  ? 30 minutes of exercise at least 5 days a week.  ? Reducing the amount of calories, sugar, and fat you eat.  · Eat a wide variety of fresh fruits, vegetables, and whole grains. These foods are high in fiber.  · Eat foods that contain healthy fats, such as fatty fish, nuts, seeds, and olive oil.  · Avoid foods that are high in added sugar, added salt (sodium), saturated fat, and trans fat.  · Avoid low-fiber, refined carbohydrates such as white bread, crackers, noodles, and white rice.  · Avoid foods with partially hydrogenated oils (trans fats), such as fried foods or stick margarine.  · Limit alcohol intake to no more than 1 drink a day for nonpregnant women and 2 drinks a day for men. One drink equals 12 oz of beer, 5 oz of wine, or 1½ oz of hard liquor. Your health care provider may recommend that you drink less depending on your overall health.  Reading food labels  · Check food labels for the amount of saturated fat. Choose foods with no or very little saturated fat.  · Check food labels for the amount of trans fat. Choose foods with no trans fat.  · Check food labels for the amount of cholesterol. Choose foods low in cholesterol. Ask your dietitian how much cholesterol you should have each day.  · Check food labels for the amount of sodium. Choose foods with less than 140 milligrams (mg) per  serving.  Shopping  · Buy dairy products labeled as nonfat (skim) or low-fat (1%).  · Avoid buying processed or prepackaged foods. These are often high in added sugar, sodium, and fat.  Cooking  · Choose healthy fats when cooking, such as olive oil or canola oil.  · Cook foods using lower fat methods, such as baking, broiling, boiling, or grilling.  · Make your own sauces, dressings, and marinades when possible, instead of buying them. Store-bought sauces, dressings, and marinades are often high in sodium and sugar.  Meal planning  · Eat more home-cooked food and less restaurant, buffet, and fast food.  · Eat fatty fish at least 2 times each week. Examples of fatty fish include salmon, trout, mackerel, tuna, and herring.  · If you eat whole eggs, do not eat more than 3 egg yolks per week.  What foods are recommended?  The items listed may not be a complete list. Talk with your dietitian about what dietary choices are best for you.  Grains  Whole wheat or whole grain breads, crackers, cereals, and pasta. Unsweetened oatmeal. Bulgur. Barley. Quinoa. Brown rice. Whole wheat flour tortillas.  Vegetables  Fresh or frozen vegetables. Low-sodium canned vegetables.  Fruits  All fresh, canned (in natural juice), or frozen fruits.  Meats and other protein foods  Skinless chicken or turkey. Ground chicken or turkey. Lean cuts of pork, trimmed of   fat. Fish and seafood, especially salmon, trout, and herring. Egg whites. Dried beans, peas, or lentils. Unsalted nuts or seeds. Unsalted canned beans. Natural peanut or almond butter.  Dairy  Low-fat dairy products. Skim or low-fat (1%) milk. Reduced fat (2%) and low-sodium cheese. Low-fat ricotta cheese. Low-fat cottage cheese. Plain, low-fat yogurt.  Fats and oils  Tub margarine without trans fats. Light or reduced-fat mayonnaise. Light or reduced-fat salad dressings. Avocado. Safflower, olive, sunflower, soybean, and canola oils.  What foods are not recommended?  The items listed  may not be a complete list. Talk with your dietitian about what dietary choices are best for you.  Grains  White bread. White (regular) pasta. White rice. Cornbread. Bagels. Pastries. Crackers that contain trans fat.  Vegetables  Creamed or fried vegetables. Vegetables in a cheese sauce.  Fruits  Sweetened dried fruit. Canned fruit in syrup. Fruit juice.  Meats and other protein foods  Fatty cuts of meat. Ribs. Chicken wings. Bacon. Sausage. Bologna. Salami. Chitterlings. Fatback. Hot dogs. Bratwurst. Packaged lunch meats.  Dairy  Whole or reduced-fat (2%) milk. Half-and-half. Cream cheese. Full-fat or sweetened yogurt. Full-fat cheese. Nondairy creamers. Whipped toppings. Processed cheese or cheese spreads. Cheese curds.  Beverages  Alcohol. Sweetened drinks, such as soda, lemonade, fruit drinks, or punches.  Fats and oils  Butter. Stick margarine. Lard. Shortening. Ghee. Bacon fat. Tropical oils, such as coconut, palm kernel, or palm oils.  Sweets and desserts  Corn syrup. Sugars. Honey. Molasses. Candy. Jam and jelly. Syrup. Sweetened cereals. Cookies. Pies. Cakes. Donuts. Muffins. Ice cream.  Condiments  Store-bought sauces, dressings, and marinades that are high in sugar, such as ketchup and barbecue sauce.  Summary  · High levels of triglycerides can increase the risk of heart disease and stroke. Choosing the right foods can help lower your triglycerides.  · Eat plenty of fresh fruits, vegetables, and whole grains. Choose low-fat dairy and lean meats. Eat fatty fish at least twice a week.  · Avoid processed and prepackaged foods with added sugar, sodium, saturated fat, and trans fat.  · If you need suggestions or have questions about what types of food are good for you, talk with your health care provider or a dietitian.  This information is not intended to replace advice given to you by your health care provider. Make sure you discuss any questions you have with your health care provider.  Document Revised:  08/20/2019 Document Reviewed: 08/20/2019  Elsevier Patient Education © 2021 [employer]

## 2020-08-24 ENCOUNTER — Encounter: Payer: Self-pay | Admitting: Oncology

## 2020-08-24 ENCOUNTER — Other Ambulatory Visit: Payer: Self-pay

## 2020-08-24 ENCOUNTER — Inpatient Hospital Stay: Payer: Self-pay

## 2020-08-24 ENCOUNTER — Inpatient Hospital Stay: Payer: Self-pay | Attending: Oncology | Admitting: Oncology

## 2020-08-24 VITALS — BP 136/90 | HR 100 | Temp 98.5°F | Resp 20 | Wt 234.1 lb

## 2020-08-24 DIAGNOSIS — Z8616 Personal history of COVID-19: Secondary | ICD-10-CM | POA: Insufficient documentation

## 2020-08-24 DIAGNOSIS — Z86711 Personal history of pulmonary embolism: Secondary | ICD-10-CM

## 2020-08-24 DIAGNOSIS — Z7901 Long term (current) use of anticoagulants: Secondary | ICD-10-CM | POA: Insufficient documentation

## 2020-08-24 LAB — ANTITHROMBIN III: AntiThromb III Func: 102 % (ref 75–120)

## 2020-08-24 NOTE — Progress Notes (Signed)
Hematology/Oncology Consult note [facility] Telephone:([phone] [phone]  Patient Care Team: Patient, No Pcp Per (Inactive) as PCP - General (General Practice)   Name of the patient: Chad Cannon  [id number]  [birth year]    Reason for referral-history of pulmonary embolism in December 2021   Referring physician- Eulogio Bear NP  Date of visit: 08/24/20   History of presenting illness-patient is a 30 year old male who was diagnosed with Acute pulmonary embolism involving the right upper and lower lobe in December 2021 when he presented with symptoms of pleuritic chest pain and exertional shortness of breath.  This was 1 to 2 weeks following his COVID diagnosis.  Patient was started on Xarelto and remains on Xarelto since then.  No family history of PE.  No prior history of DVT or PE.  He had a bilateral lower extremity ultrasound which was negative for thrombosis.  He is a non-smoker.  Presently he is doing well and denies any complaints at this time  ECOG PS- 0  Pain scale- 0   Review of systems- Review of Systems  Constitutional: Negative for chills, fever, malaise/fatigue and weight loss.  HENT: Negative for congestion, ear discharge and nosebleeds.   Eyes: Negative for blurred vision.  Respiratory: Negative for cough, hemoptysis, sputum production, shortness of breath and wheezing.   Cardiovascular: Negative for chest pain, palpitations, orthopnea and claudication.  Gastrointestinal: Negative for abdominal pain, blood in stool, constipation, diarrhea, heartburn, melena, nausea and vomiting.  Genitourinary: Negative for dysuria, flank pain, frequency, hematuria and urgency.  Musculoskeletal: Negative for back pain, joint pain and myalgias.  Skin: Negative for rash.  Neurological: Negative for dizziness, tingling, focal weakness, seizures, weakness and headaches.  Endo/Heme/Allergies: Does not bruise/bleed easily.   Psychiatric/Behavioral: Negative for depression and suicidal ideas. The patient does not have insomnia.     No Known Allergies  Patient Active Problem List   Diagnosis Date Noted  . High triglycerides 08/12/2020  . Encounter to establish care 06/16/2020  . COVID-19 04/14/2020  . Acute hypoxemic respiratory failure due to COVID-19 (HCC) 04/12/2020  . Acute pulmonary embolism without acute cor pulmonale (HCC) 04/12/2020     Past Medical History:  Diagnosis Date  . Pulmonary embolism (HCC) 03/2020     History reviewed. No pertinent surgical history.  Social History   Socioeconomic History  . Marital status: Married    Spouse name: Not on file  . Number of children: Not on file  . Years of education: Not on file  . Highest education level: Not on file  Occupational History  . Not on file  Tobacco Use  . Smoking status: Never Smoker  . Smokeless tobacco: Never Used  Substance and Sexual Activity  . Alcohol use: Never    Comment: socially  . Drug use: Never  . Sexual activity: Not Currently  Other Topics Concern  . Not on file  Social History Narrative  . Not on file   Social Determinants of Health   Financial Resource Strain: Not on file  Food Insecurity: Not on file  Transportation Needs: Not on file  Physical Activity: Not on file  Stress: Not on file  Social Connections: Not on file  Intimate Partner Violence: Not on file     History reviewed. No pertinent family history.   Current Outpatient Medications:  .  rivaroxaban (XARELTO) 20 MG TABS tablet, TAKE ONE TABLET BY MOUTH WITH SUPPER., Disp: 30 tablet, Rfl: 5   Physical exam:  Vitals:   08/24/20  1121  BP: 136/90  Pulse: 100  Resp: 20  Temp: 98.5 F (36.9 C)  TempSrc: Tympanic  SpO2: 98%  Weight: 234 lb 1.6 oz (106.2 kg)   Physical Exam Constitutional:      General: He is not in acute distress. Cardiovascular:     Rate and Rhythm: Normal rate and regular rhythm.     Heart sounds:  Normal heart sounds.  Pulmonary:     Effort: Pulmonary effort is normal.     Breath sounds: Normal breath sounds.  Skin:    General: Skin is warm and dry.  Neurological:     Mental Status: He is alert and oriented to person, place, and time.        CMP Latest Ref Rng & Units 06/16/2020  Glucose 65 - 99 mg/dL 84  BUN 6 - 20 mg/dL 15  Creatinine 3.00 - 7.62 mg/dL 2.63  Sodium 335 - 456 mmol/L 142  Potassium 3.5 - 5.2 mmol/L 3.9  Chloride 96 - 106 mmol/L 102  CO2 20 - 29 mmol/L 23  Calcium 8.7 - 10.2 mg/dL 9.6  Total Protein 6.0 - 8.5 g/dL 6.8  Total Bilirubin 0.0 - 1.2 mg/dL <2.5  Alkaline Phos 44 - 121 IU/L 63  AST 0 - 40 IU/L 29  ALT 0 - 44 IU/L 39   CBC Latest Ref Rng & Units 06/16/2020  WBC 3.4 - 10.8 x10E3/uL 6.0  Hemoglobin 13.0 - 17.7 g/dL 63.8  Hematocrit 93.7 - 51.0 % 41.8  Platelets 150 - 450 x10E3/uL 345     Assessment and plan- Patient is a 30 y.o. male diagnosed with acute pulmonary embolism involving the right upper lobe and lower lobe in December 2021 shortly following COVID pneumonia  Suspect patient's pulmonary embolism episode was provoked secondary to COVID.  COVID cannot cause a transient hyper coagulable state which can increase risk of thrombosis.  Discussed with the patient that he at least needs to stay on anticoagulation for 6 months and he can come off in mid June 2022.  If he were to have a recurrent episode of DVT or PE in the future he will need to remain on lifelong anticoagulation.  I will proceed with a hypercoagulable work-up at this time including antiphospholipid antibody syndrome testing, factor V Leiden, prothrombin gene mutation, protein C protein S and Antithrombin III levels.  If there is a significant hypercoagulable cause found that may be a reason to continue anticoagulation beyond 6 months.  I will see him back in 2 weeks for a video visit to discuss the results of his hypercoagulable work-up   Thank you for this kind referral and the  opportunity to participate in the care of this patient   Visit Diagnosis 1. History of pulmonary embolism     Dr. Owens Shark, MD, MPH [facility] at [facility] [phone] 08/24/2020 1:00 PM

## 2020-08-25 LAB — HEX PHASE PHOSPHOLIPID REFLEX

## 2020-08-25 LAB — HEXAGONAL PHASE PHOSPHOLIPID: Hex Phosph Neut Test: 0 s (ref 0–11)

## 2020-08-25 LAB — LUPUS ANTICOAGULANT
DRVVT: 36.2 s (ref 0.0–47.0)
PTT Lupus Anticoagulant: 36.5 s (ref 0.0–51.9)
Thrombin Time: 18.1 s (ref 0.0–23.0)
dPT Confirm Ratio: 0.9 Ratio (ref 0.00–1.34)
dPT: 32.9 s (ref 0.0–47.6)

## 2020-08-25 LAB — PROTEIN S PANEL
Protein S Activity: 114 % (ref 63–140)
Protein S Ag, Free: 109 % (ref 61–136)
Protein S Ag, Total: 80 % (ref 60–150)

## 2020-08-26 LAB — CARDIOLIPIN ANTIBODIES, IGG, IGM, IGA
Anticardiolipin IgA: <9 APL U/mL (ref 0–11)
Anticardiolipin IgG: <9 GPL U/mL (ref 0–14)
Anticardiolipin IgM: <9 MPL U/mL (ref 0–12)

## 2020-08-26 LAB — PROTEIN C, TOTAL: Protein C, Total: 121 % (ref 60–150)

## 2020-08-26 LAB — BETA-2-GLYCOPROTEIN I ABS, IGG/M/A
Beta-2 Glyco I IgG: <9 GPI IgG units (ref 0–20)
Beta-2-Glycoprotein I IgA: <9 GPI IgA units (ref 0–25)
Beta-2-Glycoprotein I IgM: <9 GPI IgM units (ref 0–32)

## 2020-08-30 LAB — FACTOR 5 LEIDEN

## 2020-08-30 LAB — PROTHROMBIN GENE MUTATION

## 2020-08-31 ENCOUNTER — Encounter: Payer: Self-pay | Admitting: Gerontology

## 2020-08-31 ENCOUNTER — Ambulatory Visit: Payer: Self-pay | Admitting: Gerontology

## 2020-08-31 ENCOUNTER — Other Ambulatory Visit: Payer: Self-pay

## 2020-08-31 DIAGNOSIS — Z7689 Persons encountering health services in other specified circumstances: Secondary | ICD-10-CM | POA: Insufficient documentation

## 2020-08-31 NOTE — Progress Notes (Signed)
Established Patient Office Visit  Subjective:  Patient ID: Chad Cannon, male    DOB: 18-Nov-1990  Age: 30 y.o. MRN: [id number]  CC:  Chief Complaint  Patient presents with  . Follow-up    Needs form completed for Xarelto    HPI Chad Cannon 30 y/o male who has history of Pulmonary embolism presents for completion of document to return to work. He states that he is compliant with his medication,and denies any side effects. He denies chest pain, palpitation, shortness of breath, hematuria, hematochezia and active bleeding. Overall, he states that he's ready to start working and offers no further complaint.  Past Medical History:  Diagnosis Date  . Pulmonary embolism (HCC) 03/2020    Past Surgical History:  Procedure Laterality Date  . NO PAST SURGERIES      Family History  Problem Relation Age of Onset  . Healthy Mother   . Healthy Father     Social History   Socioeconomic History  . Marital status: Married    Spouse name: Not on file  . Number of children: Not on file  . Years of education: Not on file  . Highest education level: Not on file  Occupational History  . Not on file  Tobacco Use  . Smoking status: Never Smoker  . Smokeless tobacco: Never Used  Vaping Use  . Vaping Use: Never used  Substance and Sexual Activity  . Alcohol use: Yes    Comment: socially  . Drug use: Never  . Sexual activity: Not Currently  Other Topics Concern  . Not on file  Social History Narrative  . Not on file   Social Determinants of Health   Financial Resource Strain: Not on file  Food Insecurity: No Food Insecurity  . Worried About Programme researcher, broadcasting/film/video in the Last Year: Never true  . Ran Out of Food in the Last Year: Never true  Transportation Needs: No Transportation Needs  . Lack of Transportation (Medical): No  . Lack of Transportation (Non-Medical): No  Physical Activity: Not on file  Stress: Not on file  Social Connections: Not on file  Intimate  Partner Violence: Not on file    Outpatient Medications Prior to Visit  Medication Sig Dispense Refill  . rivaroxaban (XARELTO) 20 MG TABS tablet TAKE ONE TABLET BY MOUTH WITH SUPPER. 30 tablet 5   No facility-administered medications prior to visit.    No Known Allergies  ROS Review of Systems  Constitutional: Negative.   HENT: Negative.   Eyes: Negative.   Respiratory: Negative.   Cardiovascular: Negative.   Gastrointestinal: Negative.   Endocrine: Negative.   Genitourinary: Negative.   Musculoskeletal: Negative.   Skin: Negative.   Neurological: Negative.   Hematological: Negative.   Psychiatric/Behavioral: Negative.       Objective:    Physical Exam HENT:     Head: Normocephalic and atraumatic.     Nose: Nose normal.     Mouth/Throat:     Mouth: Mucous membranes are moist.  Eyes:     Extraocular Movements: Extraocular movements intact.     Conjunctiva/sclera: Conjunctivae normal.     Pupils: Pupils are equal, round, and reactive to light.  Cardiovascular:     Rate and Rhythm: Normal rate and regular rhythm.     Pulses: Normal pulses.     Heart sounds: Normal heart sounds.  Pulmonary:     Effort: Pulmonary effort is normal.     Breath sounds: Normal breath sounds.  Abdominal:  General: Abdomen is flat. Bowel sounds are normal.     Palpations: Abdomen is soft.  Musculoskeletal:        General: Normal range of motion.     Cervical back: Normal range of motion.  Skin:    General: Skin is warm.  Neurological:     General: No focal deficit present.     Mental Status: He is alert and oriented to person, place, and time. Mental status is at baseline.  Psychiatric:        Mood and Affect: Mood normal.        Behavior: Behavior normal.        Thought Content: Thought content normal.        Judgment: Judgment normal.     BP 130/86 (BP Location: Left Arm, Patient Position: Sitting, Cuff Size: Large)   Pulse 89   Temp 97.9 F (36.6 C)   Resp 18   Ht  5\' 9"  (1.753 m)   Wt 233 lb 3.2 oz (105.8 kg)   SpO2 96%   BMI 34.44 kg/m  Wt Readings from Last 3 Encounters:  08/31/20 233 lb 3.2 oz (105.8 kg)  08/24/20 234 lb 1.6 oz (106.2 kg)  08/12/20 233 lb 14.4 oz (106.1 kg)   Weight loss encouraged  Health Maintenance Due  Topic Date Due  . Hepatitis C Screening  Never done  . COVID-19 Vaccine (1) Never done  . TETANUS/TDAP  Never done    There are no preventive care reminders to display for this patient.  No results found for: TSH Lab Results  Component Value Date   WBC 6.0 06/16/2020   HGB 14.4 06/16/2020   HCT 41.8 06/16/2020   MCV 83 06/16/2020   PLT 345 06/16/2020   Lab Results  Component Value Date   NA 142 06/16/2020   K 3.9 06/16/2020   CO2 23 06/16/2020   GLUCOSE 84 06/16/2020   BUN 15 06/16/2020   CREATININE 0.97 06/16/2020   BILITOT <0.2 06/16/2020   ALKPHOS 63 06/16/2020   AST 29 06/16/2020   ALT 39 06/16/2020   PROT 6.8 06/16/2020   ALBUMIN 4.7 06/16/2020   CALCIUM 9.6 06/16/2020   ANIONGAP 13 04/16/2020   Lab Results  Component Value Date   CHOL 152 06/16/2020   Lab Results  Component Value Date   HDL 50 06/16/2020   Lab Results  Component Value Date   LDLCALC 62 06/16/2020   Lab Results  Component Value Date   TRIG 249 (H) 06/16/2020   Lab Results  Component Value Date   CHOLHDL 3.0 06/16/2020   Lab Results  Component Value Date   HGBA1C 5.4 06/16/2020      Assessment & Plan:   1. Return to work evaluation - He will continue on Xarelto pending F/U appointment with Hematologist Dr 06/18/2020. After his physical exam, he reports been fit and denies any mental or physical symptoms that will deter him from performing his job with the [employer] at [employer]. He was advised to notify clinic or go to the ED for worsening symptoms.      Follow-up: Return in about 3 weeks (around 09/21/2020), or if symptoms worsen or fail to improve.    Wrenn Willcox 09/23/2020, NP

## 2020-09-07 ENCOUNTER — Encounter: Payer: Self-pay | Admitting: Oncology

## 2020-09-07 ENCOUNTER — Inpatient Hospital Stay: Payer: Self-pay | Attending: Oncology | Admitting: Oncology

## 2020-09-07 ENCOUNTER — Other Ambulatory Visit: Payer: Self-pay

## 2020-09-07 DIAGNOSIS — Z86711 Personal history of pulmonary embolism: Secondary | ICD-10-CM

## 2020-09-07 NOTE — Progress Notes (Signed)
Pt to speak to MD about labs results that was done on first visit. He ha no concerns.

## 2020-09-11 DIAGNOSIS — Z86711 Personal history of pulmonary embolism: Secondary | ICD-10-CM | POA: Insufficient documentation

## 2020-09-11 NOTE — Progress Notes (Signed)
I connected with Chad Cannon on 09/11/20 at  2:00 PM EDT by video enabled telemedicine visit and verified that I am speaking with the correct person using two identifiers.   I discussed the limitations, risks, security and privacy concerns of performing an evaluation and management service by telemedicine and the availability of in-person appointments. I also discussed with the patient that there may be a patient responsible charge related to this service. The patient expressed understanding and agreed to proceed.  Other persons participating in the visit and their role in the encounter: none  Patient's location:  home Provider's location:  work  Stage manager Complaint:  Discuss results of hypercoagulable work-up  History of present illness: patient is a 30 year old male who was diagnosed with Acute pulmonary embolism involving the right upper and lower lobe in December 2021 when he presented with symptoms of pleuritic chest pain and exertional shortness of breath.  This was 1 to 2 weeks following his COVID diagnosis.  Patient was started on Xarelto and remains on Xarelto since then.  No family history of PE.  No prior history of DVT or PE.  He had a bilateral lower extremity ultrasound which was negative for thrombosis.  He is a non-smoker.  Results of hypercoagulable work-up including protein C, protein S, Antithrombin III, antiphospholipid antibody syndrome factor V Leiden and prothrombin gene mutation were unremarkable  Interval history denies any specific complaints at this time   Review of Systems  Constitutional: Negative for chills, fever, malaise/fatigue and weight loss.  HENT: Negative for congestion, ear discharge and nosebleeds.   Eyes: Negative for blurred vision.  Respiratory: Negative for cough, hemoptysis, sputum production, shortness of breath and wheezing.   Cardiovascular: Negative for chest pain, palpitations, orthopnea and claudication.  Gastrointestinal: Negative for  abdominal pain, blood in stool, constipation, diarrhea, heartburn, melena, nausea and vomiting.  Genitourinary: Negative for dysuria, flank pain, frequency, hematuria and urgency.  Musculoskeletal: Negative for back pain, joint pain and myalgias.  Skin: Negative for rash.  Neurological: Negative for dizziness, tingling, focal weakness, seizures, weakness and headaches.  Endo/Heme/Allergies: Does not bruise/bleed easily.  Psychiatric/Behavioral: Negative for depression and suicidal ideas. The patient does not have insomnia.     No Known Allergies  Past Medical History:  Diagnosis Date  . Pulmonary embolism (HCC) 03/2020    Past Surgical History:  Procedure Laterality Date  . NO PAST SURGERIES      Social History   Socioeconomic History  . Marital status: Married    Spouse name: Not on file  . Number of children: Not on file  . Years of education: Not on file  . Highest education level: Not on file  Occupational History  . Not on file  Tobacco Use  . Smoking status: Never Smoker  . Smokeless tobacco: Never Used  Vaping Use  . Vaping Use: Never used  Substance and Sexual Activity  . Alcohol use: Yes    Comment: socially  . Drug use: Never  . Sexual activity: Not Currently  Other Topics Concern  . Not on file  Social History Narrative  . Not on file   Social Determinants of Health   Financial Resource Strain: Not on file  Food Insecurity: No Food Insecurity  . Worried About Programme researcher, broadcasting/film/video in the Last Year: Never true  . Ran Out of Food in the Last Year: Never true  Transportation Needs: No Transportation Needs  . Lack of Transportation (Medical): No  . Lack of Transportation (Non-Medical): No  Physical Activity: Not on file  Stress: Not on file  Social Connections: Not on file  Intimate Partner Violence: Not on file    Family History  Problem Relation Age of Onset  . Healthy Mother   . Healthy Father      Current Outpatient Medications:  .   rivaroxaban (XARELTO) 20 MG TABS tablet, TAKE ONE TABLET BY MOUTH WITH SUPPER., Disp: 30 tablet, Rfl: 5  No results found.  No images are attached to the encounter.   CMP Latest Ref Rng & Units 06/16/2020  Glucose 65 - 99 mg/dL 84  BUN 6 - 20 mg/dL 15  Creatinine 6.94 - 8.54 mg/dL 6.27  Sodium 035 - 009 mmol/L 142  Potassium 3.5 - 5.2 mmol/L 3.9  Chloride 96 - 106 mmol/L 102  CO2 20 - 29 mmol/L 23  Calcium 8.7 - 10.2 mg/dL 9.6  Total Protein 6.0 - 8.5 g/dL 6.8  Total Bilirubin 0.0 - 1.2 mg/dL <3.8  Alkaline Phos 44 - 121 IU/L 63  AST 0 - 40 IU/L 29  ALT 0 - 44 IU/L 39   CBC Latest Ref Rng & Units 06/16/2020  WBC 3.4 - 10.8 x10E3/uL 6.0  Hemoglobin 13.0 - 17.7 g/dL 18.2  Hematocrit 99.3 - 51.0 % 41.8  Platelets 150 - 450 x10E3/uL 345     Observation/objective: Appears in no acute distress over video visit today.  Breathing is nonlabored  Assessment and plan: Patient is a 30 year old male with a history of pulmonary embolism following COVID infection currently on Xarelto.  This is a visit to discuss the results of hypercoagulable work-up  Discussed with the patient that results of hypercoagulable work-up including protein C probably protein S, Antithrombin III, antiphospholipid antibody syndrome, factor V Leiden prothrombin gene mutation were unremarkable.  It is likely that patient had a provoked episode of pulmonary embolism secondary to preceding COVID infection.  I would recommend that he should stay on anticoagulation for 6 months ending in June 2022.  Following that he can come off anticoagulation.  If he were to have any recurrent episodes of DVT or PE in the future he will need to stay on indefinite anticoagulation.  I have asked him to keep a look out for symptoms including all but not limited to lower extremity pain swelling tightness or redness, pleuritic chest pain, palpitations or worsening shortness of breath.  Follow-up instructions: No follow-up with hematology needed  at this time  I discussed the assessment and treatment plan with the patient. The patient was provided an opportunity to ask questions and all were answered. The patient agreed with the plan and demonstrated an understanding of the instructions.   The patient was advised to call back or seek an in-person evaluation if the symptoms worsen or if the condition fails to improve as anticipated.   Visit Diagnosis: 1. History of pulmonary embolism     Dr. Owens Shark, MD, MPH [facility] at [facility] Tel- [phone] 09/11/2020 10:04 AM

## 2020-09-14 ENCOUNTER — Other Ambulatory Visit: Payer: Self-pay

## 2020-09-14 MED FILL — Rivaroxaban Tab 20 MG: ORAL | 30 days supply | Qty: 30 | Fill #0 | Status: AC

## 2020-09-16 ENCOUNTER — Other Ambulatory Visit: Payer: Self-pay

## 2020-09-21 ENCOUNTER — Other Ambulatory Visit: Payer: Self-pay

## 2020-09-21 ENCOUNTER — Encounter: Payer: Self-pay | Admitting: Gerontology

## 2020-09-21 ENCOUNTER — Ambulatory Visit: Payer: Self-pay | Admitting: Gerontology

## 2020-09-21 VITALS — BP 129/86 | HR 86 | Temp 97.1°F | Resp 16 | Ht 69.0 in | Wt 232.9 lb

## 2020-09-21 DIAGNOSIS — Z86711 Personal history of pulmonary embolism: Secondary | ICD-10-CM

## 2020-09-21 NOTE — Progress Notes (Signed)
Established Patient Office Visit  Subjective:  Patient ID: Chad Cannon, male    DOB: 01/14/1991  Age: 30 y.o. MRN: [id number]  CC:  Chief Complaint  Patient presents with  . Follow-up    HPI Chad Cannon 30 y/o male who has history of Pulmonary embolism presents for routine follow up visit. He was seen by Hematologist Dr Smith Robert A on 09/07/20 for review of his hypercoagulable work-up. It was recommended for him to continue his Xarelto to June 2022. He states that he is compliant with his medication,and denies any side effects. He denies chest pain, palpitation, shortness of breath, hematuria, hematochezia and active bleeding. Overall, he states that he's doing well and offers no further complaint.  Past Medical History:  Diagnosis Date  . Pulmonary embolism (HCC) 03/2020    Past Surgical History:  Procedure Laterality Date  . NO PAST SURGERIES      Family History  Problem Relation Age of Onset  . Healthy Mother   . Healthy Father     Social History   Socioeconomic History  . Marital status: Married    Spouse name: Not on file  . Number of children: Not on file  . Years of education: Not on file  . Highest education level: Not on file  Occupational History  . Not on file  Tobacco Use  . Smoking status: Never Smoker  . Smokeless tobacco: Never Used  Vaping Use  . Vaping Use: Never used  Substance and Sexual Activity  . Alcohol use: Yes    Comment: socially  . Drug use: Never  . Sexual activity: Not Currently  Other Topics Concern  . Not on file  Social History Narrative  . Not on file   Social Determinants of Health   Financial Resource Strain: Not on file  Food Insecurity: No Food Insecurity  . Worried About Programme researcher, broadcasting/film/video in the Last Year: Never true  . Ran Out of Food in the Last Year: Never true  Transportation Needs: No Transportation Needs  . Lack of Transportation (Medical): No  . Lack of Transportation (Non-Medical): No  Physical  Activity: Not on file  Stress: Not on file  Social Connections: Not on file  Intimate Partner Violence: Not on file    Outpatient Medications Prior to Visit  Medication Sig Dispense Refill  . rivaroxaban (XARELTO) 20 MG TABS tablet TAKE ONE TABLET BY MOUTH WITH SUPPER. 30 tablet 5   No facility-administered medications prior to visit.    No Known Allergies  ROS Review of Systems  Constitutional: Negative.   Respiratory: Negative.   Cardiovascular: Negative.   Gastrointestinal: Negative.   Skin: Negative.   Neurological: Negative.   Hematological: Negative.   Psychiatric/Behavioral: Negative.       Objective:    Physical Exam HENT:     Head: Normocephalic and atraumatic.  Cardiovascular:     Rate and Rhythm: Normal rate and regular rhythm.     Pulses: Normal pulses.     Heart sounds: Normal heart sounds.  Pulmonary:     Effort: Pulmonary effort is normal.     Breath sounds: Normal breath sounds.  Skin:    General: Skin is warm.  Neurological:     General: No focal deficit present.     Mental Status: He is alert and oriented to person, place, and time. Mental status is at baseline.  Psychiatric:        Mood and Affect: Mood normal.  Behavior: Behavior normal.        Thought Content: Thought content normal.        Judgment: Judgment normal.     BP 129/86 (BP Location: Right Arm, Patient Position: Sitting, Cuff Size: Large)   Pulse 86   Temp (!) 97.1 F (36.2 C)   Resp 16   Ht 5\' 9"  (1.753 m)   Wt 232 lb 14.4 oz (105.6 kg)   SpO2 95%   BMI 34.39 kg/m  Wt Readings from Last 3 Encounters:  09/21/20 232 lb 14.4 oz (105.6 kg)  08/31/20 233 lb 3.2 oz (105.8 kg)  08/24/20 234 lb 1.6 oz (106.2 kg)   Weight loss encouraged  Health Maintenance Due  Topic Date Due  . COVID-19 Vaccine (1) Never done  . Hepatitis C Screening  Never done  . TETANUS/TDAP  Never done    There are no preventive care reminders to display for this patient.  No results  found for: TSH Lab Results  Component Value Date   WBC 6.0 06/16/2020   HGB 14.4 06/16/2020   HCT 41.8 06/16/2020   MCV 83 06/16/2020   PLT 345 06/16/2020   Lab Results  Component Value Date   NA 142 06/16/2020   K 3.9 06/16/2020   CO2 23 06/16/2020   GLUCOSE 84 06/16/2020   BUN 15 06/16/2020   CREATININE 0.97 06/16/2020   BILITOT <0.2 06/16/2020   ALKPHOS 63 06/16/2020   AST 29 06/16/2020   ALT 39 06/16/2020   PROT 6.8 06/16/2020   ALBUMIN 4.7 06/16/2020   CALCIUM 9.6 06/16/2020   ANIONGAP 13 04/16/2020   Lab Results  Component Value Date   CHOL 152 06/16/2020   Lab Results  Component Value Date   HDL 50 06/16/2020   Lab Results  Component Value Date   LDLCALC 62 06/16/2020   Lab Results  Component Value Date   TRIG 249 (H) 06/16/2020   Lab Results  Component Value Date   CHOLHDL 3.0 06/16/2020   Lab Results  Component Value Date   HGBA1C 5.4 06/16/2020      Assessment & Plan:    1. History of pulmonary embolism - He will continue his Xarelto up to June 2022, was advised to notify Hematologist, clinic and go to the ER for any signs of lower extremity pain swelling tightness or redness, pleuritic chest pain, palpitations or worsening shortness of breath, hematuria, hematochezia and active bleeding.    Follow-up: Return in about 6 months (around 03/24/2021), or if symptoms worsen or fail to improve.    Sameria Morss 03/26/2021, NP

## 2020-10-22 ENCOUNTER — Telehealth: Payer: Self-pay | Admitting: Pharmacy Technician

## 2020-10-22 NOTE — Telephone Encounter (Signed)
Patient failed to provide 2022 proof of income.  No additional medication assistance will be provided by [facility] without the required proof of income documentation.  Patient notified by letter.  Sherilyn Dacosta Care Manager [facility]    [address] [place], [place]  [address]  October 22, 2020    [address] [place], [place]  [address]  Dear Arnetha Massy:  This is to inform you that you are no longer eligible to receive medication assistance at [facility].  The reason(s) are:    _____Your total gross monthly household income exceeds 250% of the Federal Poverty Level.   _____Tangible assets (savings, checking, stocks/bonds, pension, retirement, etc.) exceeds our limit  _____You are eligible to receive benefits from [facility], [facility] or HIV Medication              Assistance Program _____You are eligible to receive benefits from a Medicare Part "D" plan _____You have prescription insurance  _____You are not an [facility] resident __X__Failure to provide all requested documentation.  Still need to provide 2021 Federal Tax Return and June of 2022 bank statement.   Medication assistance will resume once all requested documentation has been returned to our clinic.  If you have questions, please contact our clinic at [phone].    Thank you,  [facility]

## 2020-10-27 ENCOUNTER — Other Ambulatory Visit: Payer: Self-pay

## 2020-11-11 ENCOUNTER — Ambulatory Visit: Payer: Self-pay | Admitting: Gerontology

## 2020-11-19 ENCOUNTER — Other Ambulatory Visit: Payer: Self-pay

## 2021-01-14 ENCOUNTER — Telehealth: Payer: Self-pay | Admitting: Pharmacy Technician

## 2021-01-14 NOTE — Telephone Encounter (Signed)
Received 2021 Federal Tax Return.  Patient eligible to receive medication assistance at [facility] until time for re-certification in 6270, and as long as eligibility requirements continue to be met.  [facility]

## 2021-01-20 ENCOUNTER — Other Ambulatory Visit: Payer: Self-pay

## 2021-03-15 ENCOUNTER — Ambulatory Visit: Payer: Self-pay | Admitting: Gerontology

## 2021-03-15 ENCOUNTER — Other Ambulatory Visit: Payer: Self-pay

## 2021-03-15 ENCOUNTER — Encounter: Payer: Self-pay | Admitting: Gerontology

## 2021-03-15 VITALS — BP 132/92 | HR 94 | Temp 97.7°F | Resp 16 | Ht 69.0 in | Wt 244.8 lb

## 2021-03-15 DIAGNOSIS — Z0000 Encounter for general adult medical examination without abnormal findings: Secondary | ICD-10-CM | POA: Insufficient documentation

## 2021-03-15 DIAGNOSIS — R030 Elevated blood-pressure reading, without diagnosis of hypertension: Secondary | ICD-10-CM | POA: Insufficient documentation

## 2021-03-15 NOTE — Patient Instructions (Signed)

## 2021-03-15 NOTE — Progress Notes (Signed)
Established Patient Office Visit  Subjective:  Patient ID: Chad Cannon, male    DOB: October 19, 1990  Age: 30 y.o. MRN: [id number]  CC:  Chief Complaint  Patient presents with   Follow-up    HPI Chad Cannon is a 30 y/o male who has history of Pulmonary embolism presents for routine follow up visitpresents for routine follow up. He completed the course of his Xarelto in June. His blood pressure was elevated during visit, he denies chest pain, palpitation, dizziness, and blurry vision. He doesn't check his blood pressure at home, but adheres to DASH diet. Overall, he states that he's doing well and offers no further complaint.  Past Medical History:  Diagnosis Date   Pulmonary embolism ([place]) 03/2020    Past Surgical History:  Procedure Laterality Date   NO PAST SURGERIES      Family History  Problem Relation Age of Onset   Healthy Mother    Healthy Father     Social History   Socioeconomic History   Marital status: Married    Spouse name: Not on file   Number of children: Not on file   Years of education: Not on file   Highest education level: Not on file  Occupational History   Not on file  Tobacco Use   Smoking status: Never   Smokeless tobacco: Never  Vaping Use   Vaping Use: Never used  Substance and Sexual Activity   Alcohol use: Yes    Comment: socially   Drug use: Never   Sexual activity: Not Currently  Other Topics Concern   Not on file  Social History Narrative   Not on file   Social Determinants of Health   Financial Resource Strain: Not on file  Food Insecurity: No Food Insecurity   Worried About Running Out of Food in the Last Year: Never true   [place] in the Last Year: Never true  Transportation Needs: No Transportation Needs   Lack of Transportation (Medical): No   Lack of Transportation (Non-Medical): No  Physical Activity: Not on file  Stress: Not on file  Social Connections: Not on file  Intimate Partner Violence: Not  on file    Outpatient Medications Prior to Visit  Medication Sig Dispense Refill   rivaroxaban (XARELTO) 20 MG TABS tablet TAKE ONE TABLET BY MOUTH WITH SUPPER. 30 tablet 5   No facility-administered medications prior to visit.    No Known Allergies  ROS Review of Systems  Constitutional: Negative.   Eyes: Negative.   Respiratory: Negative.    Cardiovascular: Negative.   Neurological: Negative.   Psychiatric/Behavioral: Negative.       Objective:    Physical Exam HENT:     Head: Normocephalic and atraumatic.     Mouth/Throat:     Mouth: Mucous membranes are moist.  Eyes:     Extraocular Movements: Extraocular movements intact.     Conjunctiva/sclera: Conjunctivae normal.     Pupils: Pupils are equal, round, and reactive to light.  Cardiovascular:     Rate and Rhythm: Normal rate and regular rhythm.     Pulses: Normal pulses.     Heart sounds: Normal heart sounds.  Pulmonary:     Effort: Pulmonary effort is normal.     Breath sounds: Normal breath sounds.  Neurological:     General: No focal deficit present.     Mental Status: He is alert and oriented to person, place, and time. Mental status is at baseline.  Psychiatric:  Mood and Affect: Mood normal.        Behavior: Behavior normal.        Thought Content: Thought content normal.        Judgment: Judgment normal.    BP (!) 132/92 (BP Location: Right Arm, Patient Position: Sitting, Cuff Size: Large)   Pulse 94   Temp 97.7 F (36.5 C) (Oral)   Resp 16   Ht _0  (1.753 m)   Wt 244 lb 12.8 oz (111 kg)   SpO2 96%   BMI 36.15 kg/m  Wt Readings from Last 3 Encounters:  03/15/21 244 lb 12.8 oz (111 kg)  09/21/20 232 lb 14.4 oz (105.6 kg)  08/31/20 233 lb 3.2 oz (105.8 kg)   Encouraged weight loss  Health Maintenance Due  Topic Date Due   COVID-19 Vaccine (1) Never done   Pneumococcal Vaccine 76-73 Years old (1 - PCV) Never done   Hepatitis C Screening  Never done   TETANUS/TDAP  Never done     There are no preventive care reminders to display for this patient.  No results found for: TSH Lab Results  Component Value Date   WBC 6.0 06/16/2020   HGB 14.4 06/16/2020   HCT 41.8 06/16/2020   MCV 83 06/16/2020   PLT 345 06/16/2020   Lab Results  Component Value Date   NA 142 06/16/2020   K 3.9 06/16/2020   CO2 23 06/16/2020   GLUCOSE 84 06/16/2020   BUN 15 06/16/2020   CREATININE 0.97 06/16/2020   BILITOT <0.2 06/16/2020   ALKPHOS 63 06/16/2020   AST 29 06/16/2020   ALT 39 06/16/2020   PROT 6.8 06/16/2020   ALBUMIN 4.7 06/16/2020   CALCIUM 9.6 06/16/2020   ANIONGAP 13 04/16/2020   Lab Results  Component Value Date   CHOL 152 06/16/2020   Lab Results  Component Value Date   HDL 50 06/16/2020   Lab Results  Component Value Date   LDLCALC 62 06/16/2020   Lab Results  Component Value Date   TRIG 249 (H) 06/16/2020   Lab Results  Component Value Date   CHOLHDL 3.0 06/16/2020   Lab Results  Component Value Date   HGBA1C 5.4 06/16/2020      Assessment & Plan:     1. Elevated blood pressure reading - His blood pressure was elevated during visit, he was advised to check every other day, record and bring log to follow up appointment. He was advised to notify clinic if BP is consistently greater than 140/90. He was educated on Stroke signs and symptoms and advised to go to the ED. He's to continue on DASH diet, and exercise as tolerated.  2. Health care maintenance - Routine labs will be checked. - CBC w/Diff; Future - Comp Met (CMET); Future - Lipid panel; Future - HgB A1c; Future - TSH; Future     Follow-up: Return in about 14 weeks (around 06/21/2021), or if symptoms worsen or fail to improve.    Chad Cannon Jerold Coombe, NP

## 2021-06-15 ENCOUNTER — Other Ambulatory Visit: Payer: Self-pay

## 2021-06-22 ENCOUNTER — Ambulatory Visit: Payer: Self-pay | Admitting: Gerontology

## 2021-07-06 ENCOUNTER — Other Ambulatory Visit: Payer: Self-pay

## 2021-07-06 DIAGNOSIS — Z0000 Encounter for general adult medical examination without abnormal findings: Secondary | ICD-10-CM

## 2021-07-07 LAB — COMPREHENSIVE METABOLIC PANEL
ALT: 23 IU/L (ref 0–44)
AST: 24 IU/L (ref 0–40)
Albumin/Globulin Ratio: 2 (ref 1.2–2.2)
Albumin: 4.7 g/dL (ref 4.1–5.2)
Alkaline Phosphatase: 76 IU/L (ref 44–121)
BUN/Creatinine Ratio: 11 (ref 9–20)
BUN: 14 mg/dL (ref 6–20)
Bilirubin Total: <0.2 mg/dL (ref 0.0–1.2)
CO2: 21 mmol/L (ref 20–29)
Calcium: 9.5 mg/dL (ref 8.7–10.2)
Chloride: 101 mmol/L (ref 96–106)
Creatinine, Ser: 1.24 mg/dL (ref 0.76–1.27)
Globulin, Total: 2.4 g/dL (ref 1.5–4.5)
Glucose: 105 mg/dL — ABNORMAL HIGH (ref 70–99)
Potassium: 4.2 mmol/L (ref 3.5–5.2)
Sodium: 140 mmol/L (ref 134–144)
Total Protein: 7.1 g/dL (ref 6.0–8.5)
eGFR: 80 mL/min/{1.73_m2} (ref >59)

## 2021-07-07 LAB — CBC WITH DIFFERENTIAL/PLATELET
Basophils Absolute: 0 10*3/uL (ref 0.0–0.2)
Basos: 1 %
EOS (ABSOLUTE): 0.1 10*3/uL (ref 0.0–0.4)
Eos: 1 %
Hematocrit: 42.6 % (ref 37.5–51.0)
Hemoglobin: 14.3 g/dL (ref 13.0–17.7)
Immature Grans (Abs): 0 10*3/uL (ref 0.0–0.1)
Immature Granulocytes: 0 %
Lymphocytes Absolute: 2 10*3/uL (ref 0.7–3.1)
Lymphs: 39 %
MCH: 27.3 pg (ref 26.6–33.0)
MCHC: 33.6 g/dL (ref 31.5–35.7)
MCV: 82 fL (ref 79–97)
Monocytes Absolute: 0.3 10*3/uL (ref 0.1–0.9)
Monocytes: 6 %
Neutrophils Absolute: 2.7 10*3/uL (ref 1.4–7.0)
Neutrophils: 53 %
Platelets: 336 10*3/uL (ref 150–450)
RBC: 5.23 x10E6/uL (ref 4.14–5.80)
RDW: 12.9 % (ref 11.6–15.4)
WBC: 5.1 10*3/uL (ref 3.4–10.8)

## 2021-07-07 LAB — TSH: TSH: 1.98 u[IU]/mL (ref 0.450–4.500)

## 2021-07-07 LAB — LIPID PANEL
Chol/HDL Ratio: 3.5 ratio (ref 0.0–5.0)
Cholesterol, Total: 136 mg/dL (ref 100–199)
HDL: 39 mg/dL — ABNORMAL LOW (ref >39)
LDL Chol Calc (NIH): 68 mg/dL (ref 0–99)
Triglycerides: 174 mg/dL — ABNORMAL HIGH (ref 0–149)
VLDL Cholesterol Cal: 29 mg/dL (ref 5–40)

## 2021-07-07 LAB — HEMOGLOBIN A1C
Est. average glucose Bld gHb Est-mCnc: 117 mg/dL
Hgb A1c MFr Bld: 5.7 % — ABNORMAL HIGH (ref 4.8–5.6)

## 2021-07-13 ENCOUNTER — Ambulatory Visit: Payer: Self-pay | Admitting: Gerontology

## 2021-07-14 ENCOUNTER — Other Ambulatory Visit: Payer: Self-pay

## 2021-07-14 ENCOUNTER — Encounter: Payer: Self-pay | Admitting: Gerontology

## 2021-07-14 ENCOUNTER — Ambulatory Visit: Payer: Self-pay | Admitting: Gerontology

## 2021-07-14 NOTE — Progress Notes (Signed)
? ?Established Patient Office Visit ? ?Subjective:  ?Patient ID: Chad Cannon, male    DOB: 04/12/1991  Age: 31 y.o. MRN: [id number] ? ?CC:  ?Chief Complaint  ?Patient presents with  ? Follow-up  ?  Labs drawn 07/06/21  ? ? ?HPI ?Chad Cannon  is a 31 y/o male who has history of Pulmonary embolism presents for routine follow up visit and lab review. His labs done on 07/06/21 were unremarkable except for Triglyceride that was 174 mg/dl, HDL was 39 mg/dl, also HgbA1c was 5.7%.  He c/o tooth ache to left lower molar that will be extracted in few days at [facility] clinic. His heart rate was 104 b.p.m, he denies chest pain, palpitation and dizziness. Overall, he states that he's doing well and offers no further complaint. ? ? ? ?Past Medical History:  ?Diagnosis Date  ? Pulmonary embolism ([place]) 03/2020  ? ? ?Past Surgical History:  ?Procedure Laterality Date  ? NO PAST SURGERIES    ? ? ?Family History  ?Problem Relation Age of Onset  ? Healthy Mother   ? Healthy Father   ? ? ?Social History  ? ?Socioeconomic History  ? Marital status: Married  ?  Spouse name: Not on file  ? Number of children: Not on file  ? Years of education: Not on file  ? Highest education level: Not on file  ?Occupational History  ? Not on file  ?Tobacco Use  ? Smoking status: Never  ? Smokeless tobacco: Never  ?Vaping Use  ? Vaping Use: Never used  ?Substance and Sexual Activity  ? Alcohol use: Yes  ?  Comment: socially  ? Drug use: Never  ? Sexual activity: Not Currently  ?Other Topics Concern  ? Not on file  ?Social History Narrative  ? Not on file  ? ?Social Determinants of Health  ? ?Financial Resource Strain: Not on file  ?Food Insecurity: No Food Insecurity  ? Worried About Charity fundraiser in the Last Year: Never true  ? Ran Out of Food in the Last Year: Never true  ?Transportation Needs: No Transportation Needs  ? Lack of Transportation (Medical): No  ? Lack of Transportation (Non-Medical): No  ?Physical Activity: Not on file   ?Stress: Not on file  ?Social Connections: Not on file  ?Intimate Partner Violence: Not on file  ? ? ?No outpatient medications prior to visit.  ? ?No facility-administered medications prior to visit.  ? ? ?No Known Allergies ? ?ROS ?Review of Systems  ?Constitutional: Negative.   ?HENT:  Positive for dental problem.   ?Eyes: Negative.   ?Respiratory: Negative.    ?Cardiovascular: Negative.   ?Skin: Negative.   ?Neurological: Negative.   ?Psychiatric/Behavioral: Negative.    ? ?  ?Objective:  ?  ?Physical Exam ?HENT:  ?   Head: Normocephalic and atraumatic.  ?   Mouth/Throat:  ?   Mouth: Mucous membranes are moist.  ?   Dentition: Dental caries (left lower molar) present. No dental tenderness, gingival swelling or dental abscesses.  ?Eyes:  ?   Extraocular Movements: Extraocular movements intact.  ?   Conjunctiva/sclera: Conjunctivae normal.  ?   Pupils: Pupils are equal, round, and reactive to light.  ?Cardiovascular:  ?   Rate and Rhythm: Tachycardia present.  ?   Pulses: Normal pulses.  ?   Heart sounds: Normal heart sounds.  ?Pulmonary:  ?   Effort: Pulmonary effort is normal.  ?   Breath sounds: Normal breath sounds.  ?Skin: ?  General: Skin is warm.  ?Neurological:  ?   General: No focal deficit present.  ?   Mental Status: He is alert and oriented to person, place, and time. Mental status is at baseline.  ?Psychiatric:     ?   Mood and Affect: Mood normal.     ?   Behavior: Behavior normal.     ?   Thought Content: Thought content normal.     ?   Judgment: Judgment normal.  ? ? ?BP 132/87 (BP Location: Left Arm, Patient Position: Sitting, Cuff Size: Large)   Pulse (!) 104   Temp 97.9 ?F (36.6 ?C) (Oral)   Resp 16   Ht '5\' 9"'  (1.753 m)   Wt 228 lb 11.2 oz (103.7 kg)   SpO2 94%   BMI 33.77 kg/m?  ?Wt Readings from Last 3 Encounters:  ?07/14/21 228 lb 11.2 oz (103.7 kg)  ?07/06/21 231 lb 6.4 oz (105 kg)  ?03/15/21 244 lb 12.8 oz (111 kg)  ? ?Encouraged weight loss ? ?Health Maintenance Due  ?Topic Date  Due  ? COVID-19 Vaccine (1) Never done  ? Hepatitis C Screening  Never done  ? TETANUS/TDAP  Never done  ? ? ?There are no preventive care reminders to display for this patient. ? ?Lab Results  ?Component Value Date  ? TSH 1.980 07/06/2021  ? ?Lab Results  ?Component Value Date  ? WBC 5.1 07/06/2021  ? HGB 14.3 07/06/2021  ? HCT 42.6 07/06/2021  ? MCV 82 07/06/2021  ? PLT 336 07/06/2021  ? ?Lab Results  ?Component Value Date  ? NA 140 07/06/2021  ? K 4.2 07/06/2021  ? CO2 21 07/06/2021  ? GLUCOSE 105 (H) 07/06/2021  ? BUN 14 07/06/2021  ? CREATININE 1.24 07/06/2021  ? BILITOT <0.2 07/06/2021  ? ALKPHOS 76 07/06/2021  ? AST 24 07/06/2021  ? ALT 23 07/06/2021  ? PROT 7.1 07/06/2021  ? ALBUMIN 4.7 07/06/2021  ? CALCIUM 9.5 07/06/2021  ? ANIONGAP 13 04/16/2020  ? EGFR 80 07/06/2021  ? ?Lab Results  ?Component Value Date  ? CHOL 136 07/06/2021  ? ?Lab Results  ?Component Value Date  ? HDL 39 (L) 07/06/2021  ? ?Lab Results  ?Component Value Date  ? [place] 68 07/06/2021  ? ?Lab Results  ?Component Value Date  ? TRIG 174 (H) 07/06/2021  ? ?Lab Results  ?Component Value Date  ? CHOLHDL 3.5 07/06/2021  ? ?Lab Results  ?Component Value Date  ? HGBA1C 5.7 (H) 07/06/2021  ? ? ?  ?Assessment & Plan:  ? ?1. Tooth ache ?- He was encouraged to follow up with the Dentist for tooth extraction. ? ?2. Prediabetes ?- His HgbA1c was 5.7%, he declines metformin therapy, was encouraged to continue on low carb/non concentrated sweet diet and exercise as tolerated. ? ?3. Tachycardia ?- His heart rate was 104 b.p.m, he was advised to increase water intake, notify clinic and go to the ED for worsening symptoms. ? ? ? ? ? ?Follow-up: Return in about 38 weeks (around 04/06/2022), or if symptoms worsen or fail to improve.  ? ? ?Joslynn Jamroz Jerold Coombe, NP ?

## 2021-09-02 ENCOUNTER — Other Ambulatory Visit: Payer: Self-pay

## 2021-09-23 ENCOUNTER — Other Ambulatory Visit: Payer: Self-pay

## 2021-09-25 IMAGING — DX DG CHEST 1V PORT
1 series · 1 of 1 positions shown · non-contrast
Comparison: None.

CLINICAL DATA: Shortness of breath, positive COVID test [DATE]

EXAM:
PORTABLE CHEST 1 VIEW

[chest ap]
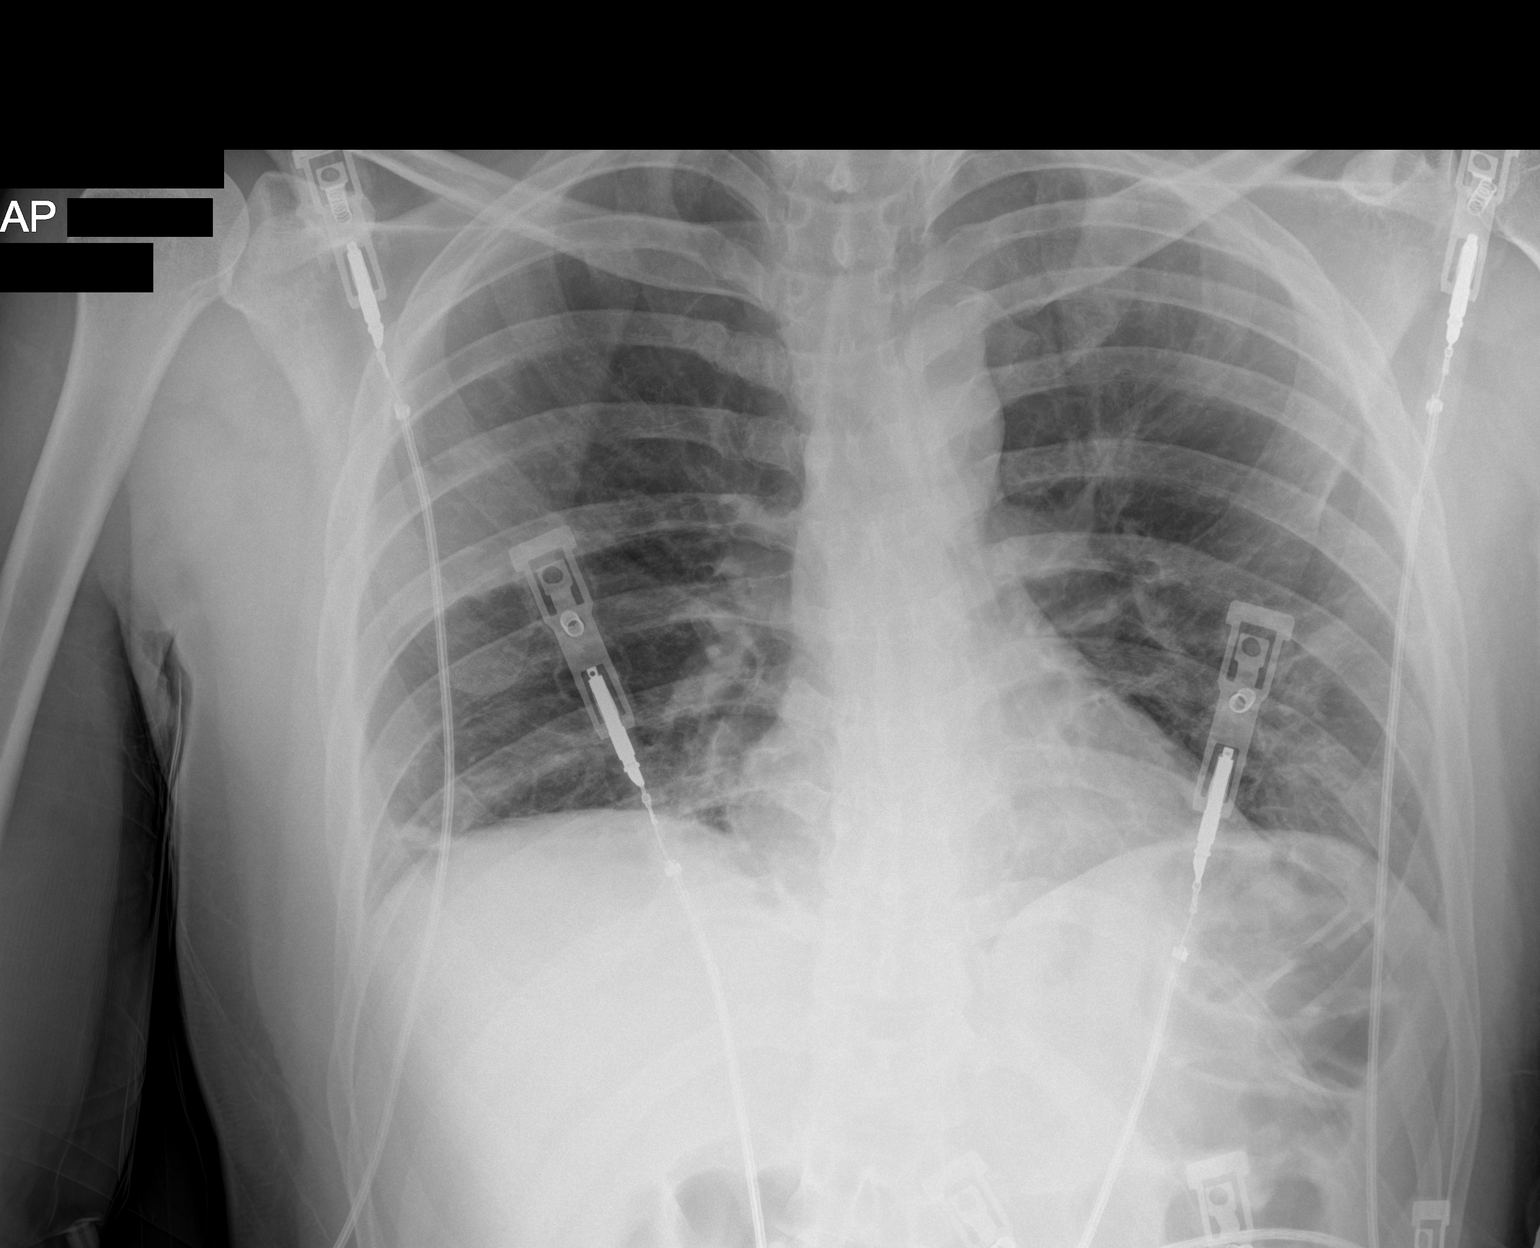

[1 of 1 positions shown; findings below may reference images not displayed]

FINDINGS: Low lung volumes secondary to poor inspiration. Mild patchy density
at the lung bases. No pleural effusion or pneumothorax.
Cardiomediastinal contours are within normal limits.
IMPRESSION: Mild patchy density at the lung bases, which may reflect atelectasis
or atypical pneumonia in the appropriate setting.

## 2021-09-26 IMAGING — US US EXTREM LOW VENOUS
1 series · 14 of 24 positions shown · non-contrast
Comparison: None

CLINICAL DATA: Leg pain, history of prior pulmonary emboli shows in
this 29-year-old male

EXAM:
Bilateral LOWER EXTREMITY VENOUS DOPPLER ULTRASOUND
TECHNIQUE: Gray-scale sonography with compression, as well as color and duplex
ultrasound, were performed to evaluate the deep venous system(s)
from the level of the common femoral vein through the popliteal and
proximal calf veins.

[Series 1: us extrem low venous · 0.08mm/px · 14 of 64 slices shown]
[im 1/64]
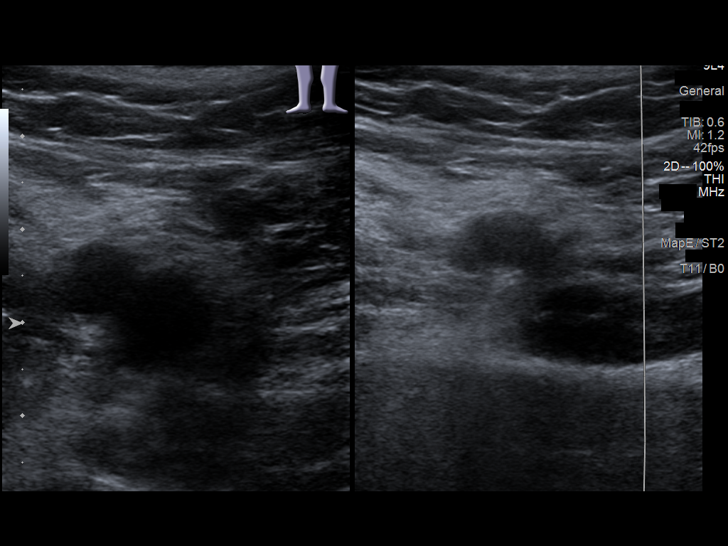
[im 6/64]
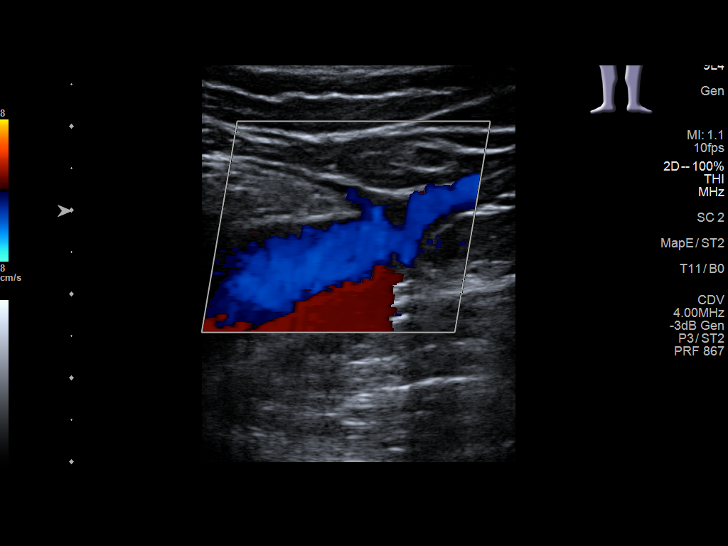
[im 11/64]
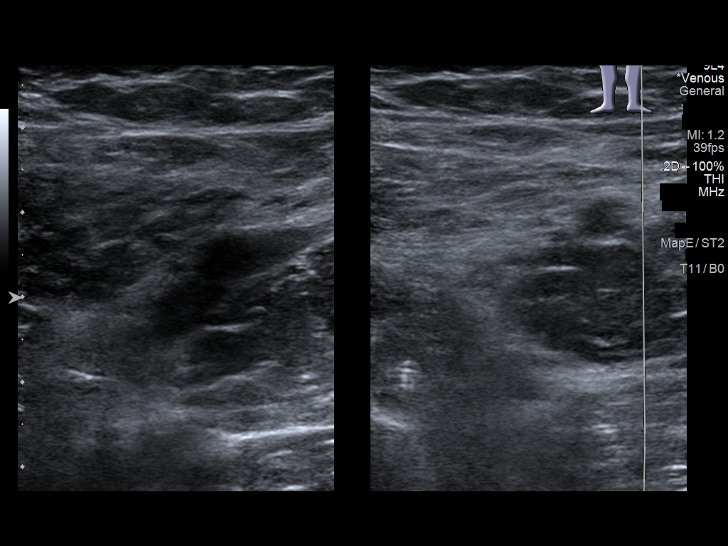
[im 17/64]
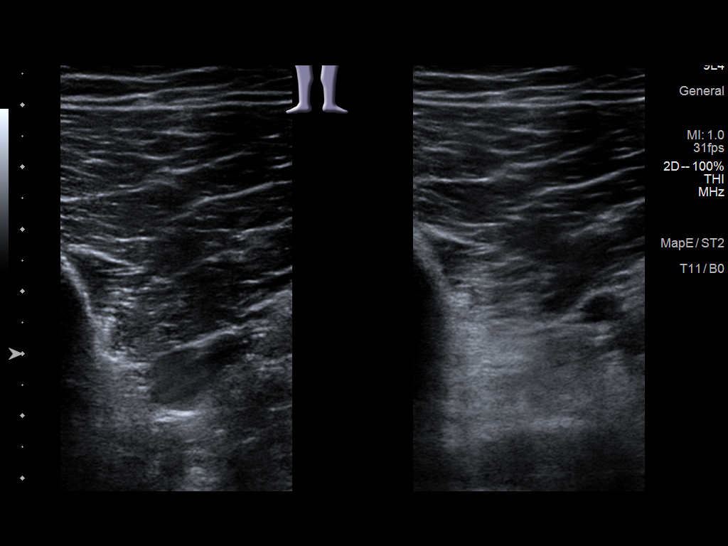
[im 20/64]
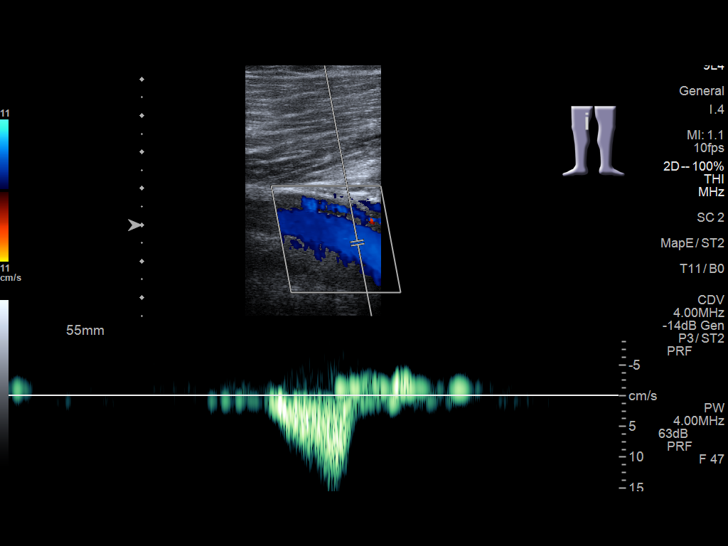
[im 25/64]
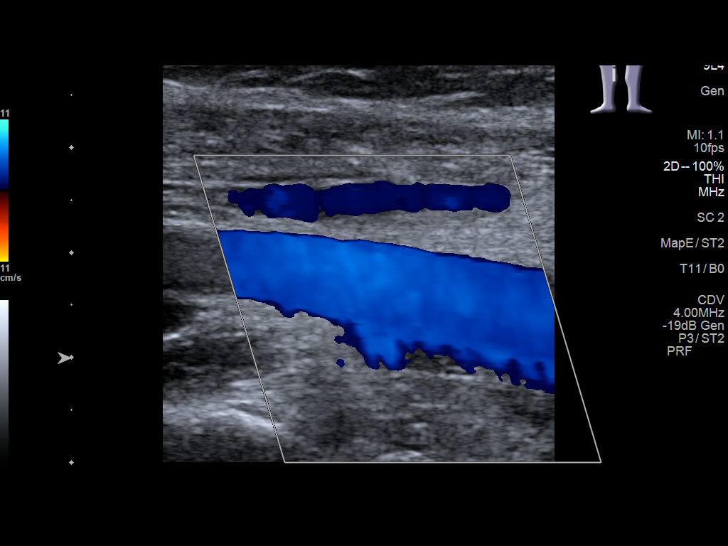
[im 31/64]
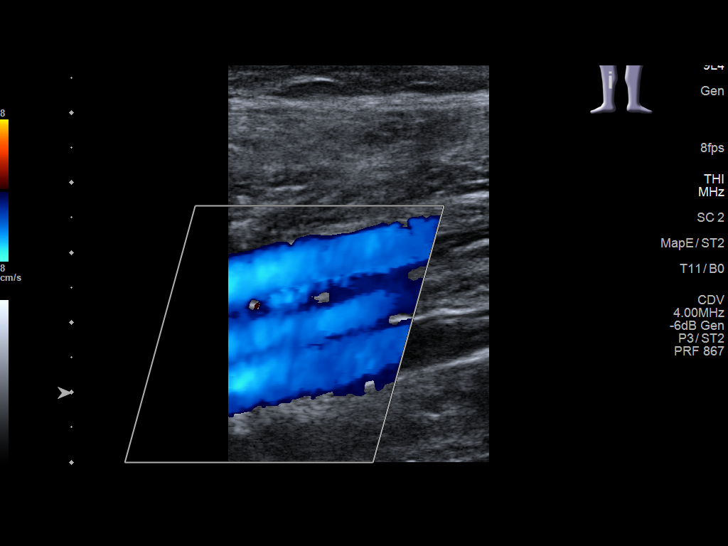
[im 33/64]
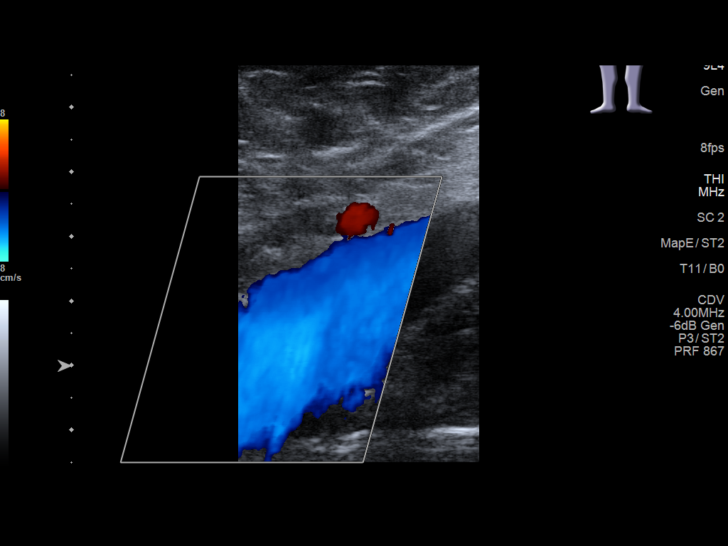
[im 39/64]
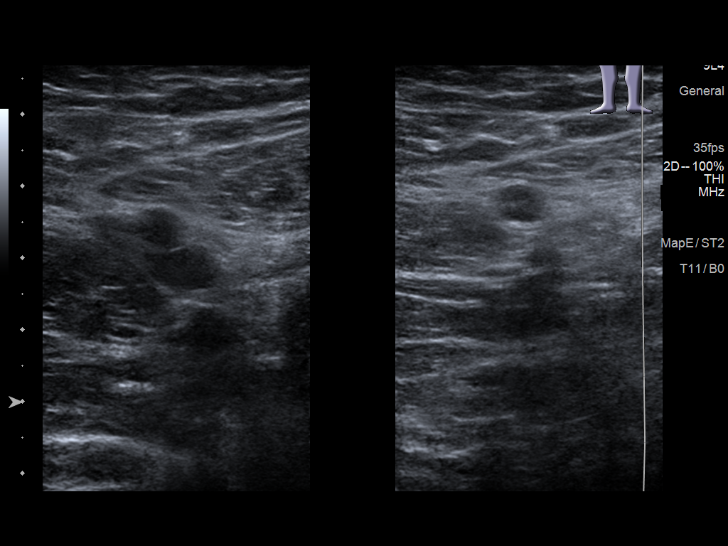
[im 44/64]
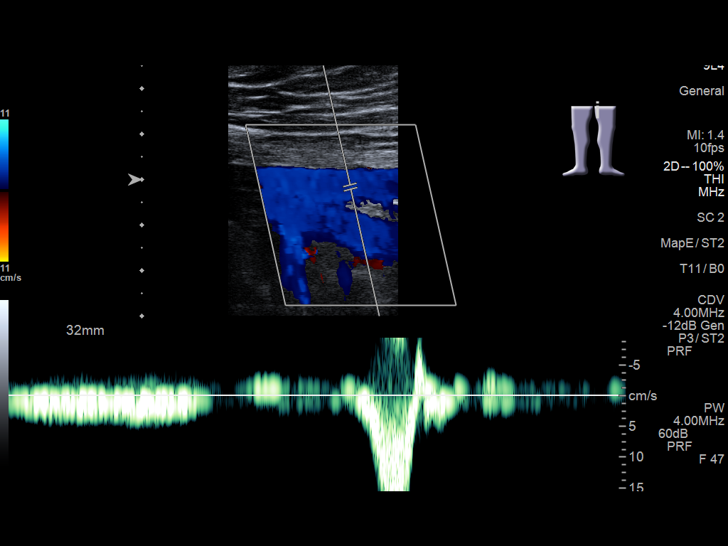
[im 50/64]
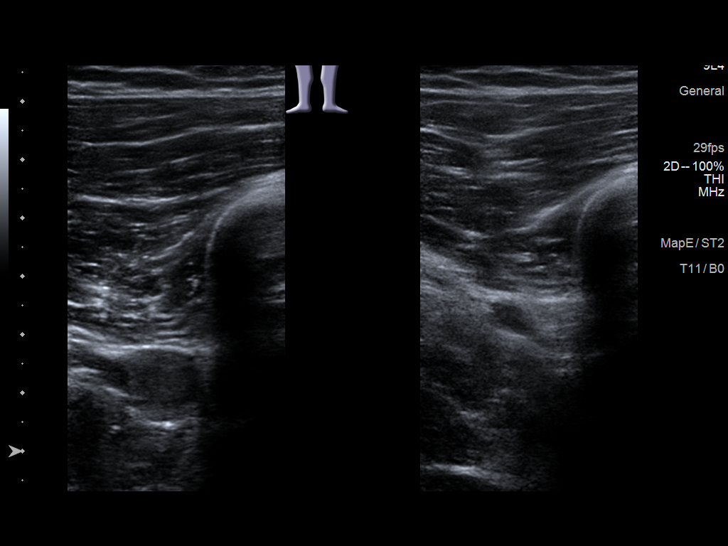
[im 53/64]
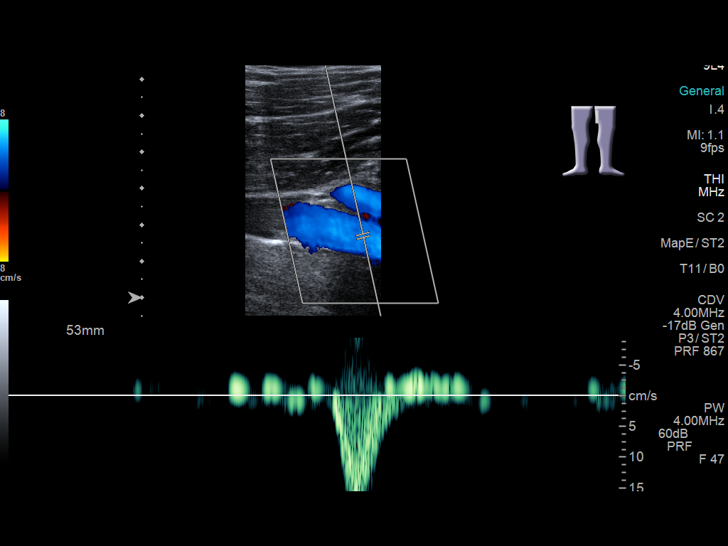
[im 58/64]
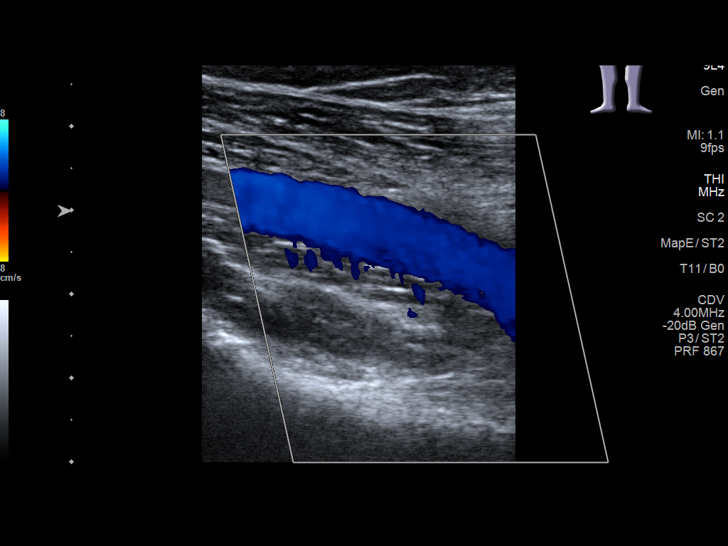
[im 64/64]
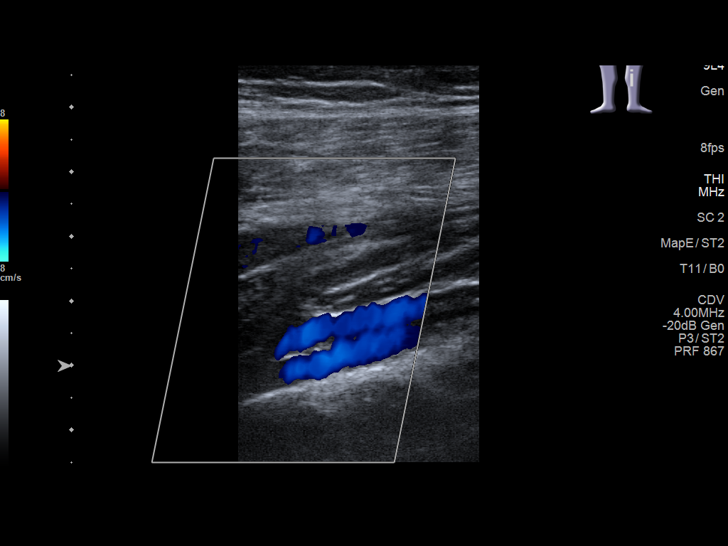

[14 of 24 positions shown; findings below may reference images not displayed]

FINDINGS: VENOUS

Normal compressibility of the common femoral, superficial femoral,
and popliteal veins, as well as the visualized calf veins.
Visualized portions of profunda femoral vein and great saphenous
vein unremarkable. No filling defects to suggest DVT on grayscale or
color Doppler imaging. Doppler waveforms show normal direction of
venous flow, normal respiratory plasticity and response to
augmentation.

OTHER

Flow related artifact in the lower extremity veins bilaterally
compatible with rouleaux flow, no sign of thrombus.

Limitations: none
IMPRESSION: Sonogram of the bilateral lower extremities, negative for deep
venous thrombosis.

## 2022-04-11 ENCOUNTER — Ambulatory Visit: Payer: Self-pay | Admitting: Gerontology

## 2022-05-30 ENCOUNTER — Ambulatory Visit: Payer: Self-pay | Admitting: Gerontology

## 2022-06-15 ENCOUNTER — Encounter: Payer: Self-pay | Admitting: Gerontology

## 2022-06-15 ENCOUNTER — Ambulatory Visit: Payer: Self-pay | Admitting: Gerontology

## 2022-06-15 VITALS — BP 132/87 | HR 91 | Wt 266.0 lb

## 2022-06-15 DIAGNOSIS — R7303 Prediabetes: Secondary | ICD-10-CM

## 2022-06-15 DIAGNOSIS — Z0000 Encounter for general adult medical examination without abnormal findings: Secondary | ICD-10-CM

## 2022-06-15 NOTE — Patient Instructions (Signed)
Pulmonary Embolism  A pulmonary embolism (PE) is a sudden blockage or decrease of blood flow in one or both lungs that happens when a clot travels into the arteries of the lung (pulmonary arteries). Most blockages come from a blood clot that forms in the vein of a leg or arm (deep vein thrombosis, DVT) and travels to the lungs. A clot is blood that has thickened into a gel or solid. PE is a dangerous and life-threatening condition that needs to be treated right away. What are the causes? This condition is usually caused by a blood clot that forms in a vein and moves to the lungs. In rare cases, it may be caused by air, fat, part of a tumor, or other tissue that moves through the veins and into the lungs. What increases the risk? The following factors may make you more likely to develop this condition: Experiencing a traumatic injury, such as breaking a hip or leg. Having: A spinal cord injury. Major surgery, especially hip or knee replacement, or surgery on parts of the nervous system or on the abdomen. A stroke. A blood-clotting disease. Long-term (chronic) lung or heart disease. Cancer, especially if you are being treated with chemotherapy. A central venous catheter. Taking medicines that contain estrogen. These include birth control pills and hormone replacement therapy. Being: Pregnant. In the period of time after your baby is delivered (postpartum). Older than age 27. Overweight. A smoker, especially if you have other risks. Not very active (sedentary), not being able to move at all, or spending long periods sitting, such as travel over 6 hours. You are also at a greater risk if you have a leg in a cast or splint. What are the signs or symptoms? Symptoms of this condition usually start suddenly and include: Shortness of breath during activity or at rest. Coughing, coughing up blood, or coughing up bloody mucus. Chest pain, back pain, or shoulder blade pain that gets worse with deep  breaths. Rapid or irregular heartbeat. Feeling light-headed or dizzy, or fainting. Feeling anxious. Pain and swelling in a leg. This is a symptom of DVT, which can lead to PE. How is this diagnosed? This condition may be diagnosed based on your medical history, a physical exam, and tests. Tests may include: Blood tests. An ECG (electrocardiogram) of the heart. A CT pulmonary angiogram. This test checks blood flow in and around your lungs. A ventilation-perfusion scan, also called a lung VQ scan. This test measures air flow and blood flow to the lungs. An ultrasound to check for a DVT. How is this treated? Treatment for this condition depends on many factors, such as the cause of your PE, your risk for bleeding or developing more clots, and other medical conditions you may have. Treatment aims to stop blood clots from forming or growing larger. In some cases, treatment may be aimed at breaking apart or removing the blood clot. Treatment may include: Medicines, such as: Blood thinning medicines, also called anticoagulants, to stop clots from forming and growing. Medicines that break apart clots (fibrinolytics). Procedures, such as: Using a flexible tube to remove a blood clot (embolectomy) or to deliver medicine to destroy it (catheter-directed thrombolysis). Surgery to remove the clot (surgical embolectomy). This is rare. You may need a combination of immediate, long-term, and extended treatments. Your treatment may continue for several months (maintenance therapy) or longer depending on your medical conditions. You and your health care provider will work together to choose the treatment program that is best for you.  Follow these instructions at home: Medicines Take over-the-counter and prescription medicines only as told by your health care provider. If you are taking blood thinners: Talk with your health care provider before you take any medicines that contain aspirin or NSAIDs, such as  ibuprofen. These medicines increase your risk for dangerous bleeding. Take your medicine exactly as told, at the same time every day. Avoid activities that could cause injury or bruising, and follow instructions about how to prevent falls. Wear a medical alert bracelet or carry a card that lists what medicines you take. Understand what foods and drugs interact with any medicines that you are taking. General instructions Ask your health care provider when you may return to your normal activities. Avoid sitting or lying for a long time without moving. Maintain a healthy weight. Ask your health care provider what weight is healthy for you. Do not use any products that contain nicotine or tobacco. These products include cigarettes, chewing tobacco, and vaping devices, such as e-cigarettes. If you need help quitting, ask your health care provider. Talk with your health care provider about any travel plans. It is important to make sure that you are still able to take your medicine while traveling. Keep all follow-up visits. This is important. Where to find more information American Lung Association: www.lung.org Centers for Disease Control and Prevention: http://www.wolf.info/ Contact a health care provider if: You missed a dose of your blood thinner medicine. You have a fever. Get help right away if: You have: New or increased pain, swelling, warmth, or redness in an arm or leg. Shortness of breath that gets worse during activity or at rest. Worsening chest pain. A rapid or irregular heartbeat. A severe headache. Vision changes. A serious fall or accident, or you hit your head. Blood in your vomit, stool, or urine. A cut that will not stop bleeding. You cough up blood. You feel light-headed or dizzy, and that feeling does not go away. You cannot move your arms or legs. You are confused or have memory loss. These symptoms may represent a serious problem that is an emergency. Do not wait to see if the  symptoms will go away. Get medical help right away. Call your local emergency services (911 in the [place] Do not drive yourself to the hospital. Summary A pulmonary embolism (PE) is a serious and potentially life-threatening condition. It happens when a blood clot from one part of the body travels to the arteries of the lung, causing a sudden blockage or decrease of blood flow to the lungs. This may result in shortness of breath, chest pain, dizziness, and fainting. Treatments for this condition usually include medicines to thin your blood (anticoagulants) or medicines to break apart blood clots. If you are given blood thinners, take your medicine exactly as told by your health care provider, at the same time every day. This is important. Understand what foods and drugs interact with any medicines that you are taking. If you have signs of PE or DVT, call your local emergency services (911 in the [place] This information is not intended to replace advice given to you by your health care provider. Make sure you discuss any questions you have with your health care provider. Document Revised: 03/19/2020 Document Reviewed: 03/19/2020 Elsevier Patient Education  [place].

## 2022-06-15 NOTE — Progress Notes (Signed)
Established Patient Office Visit  Subjective   Patient ID: Chad Cannon, male    DOB: 1990-06-30  Age: 32 y.o. MRN: [id number]  Chief Complaint  Patient presents with   Follow-up    HPI  Chad Cannon  is a 32 y/o male who has history of pulmonary embolism and is present in clinic for a follow up visit. He  denies any chest pain and shortness of breath.  He states that he has been incorprating fruits and vegetables into his diet and brisk walking into his daily routine. He states that he is drinking a lot of water daily. He states that he drinks a small glass of alcohol weekly but denies any illicit drugs. Overall, he is doing well and offers no further complaints.   Review of Systems  Constitutional: Negative.   HENT: Negative.    Eyes: Negative.   Respiratory: Negative.    Cardiovascular: Negative.   Gastrointestinal: Negative.   Genitourinary: Negative.   Musculoskeletal: Negative.   Skin: Negative.   Neurological: Negative.   Endo/Heme/Allergies: Negative.   Psychiatric/Behavioral: Negative.        Objective:     BP 132/87   Pulse 91   Wt 266 lb (120.7 kg)   SpO2 96%   BMI 39.28 kg/m  BP Readings from Last 3 Encounters:  06/15/22 132/87  07/14/21 132/87  07/06/21 137/82   Wt Readings from Last 3 Encounters:  06/15/22 266 lb (120.7 kg)  07/14/21 228 lb 11.2 oz (103.7 kg)  07/06/21 231 lb 6.4 oz (105 kg)    Encouraged on weight loss regimen.  Physical Exam Constitutional:      Appearance: He is normal weight.  HENT:     Head: Normocephalic.     Nose: Nose normal.     Mouth/Throat:     Mouth: Mucous membranes are moist.     Pharynx: Oropharynx is clear.  Eyes:     Conjunctiva/sclera: Conjunctivae normal.  Cardiovascular:     Rate and Rhythm: Normal rate and regular rhythm.     Pulses: Normal pulses.     Heart sounds: Normal heart sounds.  Pulmonary:     Effort: Pulmonary effort is normal.     Breath sounds: Normal breath sounds.   Abdominal:     General: Abdomen is flat. Bowel sounds are normal.     Palpations: Abdomen is soft.  Musculoskeletal:        General: Normal range of motion.  Skin:    General: Skin is warm and dry.  Neurological:     General: No focal deficit present.     Mental Status: He is alert and oriented to person, place, and time. Mental status is at baseline.  Psychiatric:        Mood and Affect: Mood normal.        Behavior: Behavior normal.        Thought Content: Thought content normal.        Judgment: Judgment normal.      No results found for any visits on 06/15/22.  Last CBC Lab Results  Component Value Date   WBC 5.1 07/06/2021   HGB 14.3 07/06/2021   HCT 42.6 07/06/2021   MCV 82 07/06/2021   MCH 27.3 07/06/2021   RDW 12.9 07/06/2021   PLT 336 [id number]   Last metabolic panel Lab Results  Component Value Date   GLUCOSE 105 (H) 07/06/2021   NA 140 07/06/2021   K 4.2 07/06/2021   CL 101 07/06/2021  CO2 21 07/06/2021   BUN 14 07/06/2021   CREATININE 1.24 07/06/2021   EGFR 80 07/06/2021   CALCIUM 9.5 07/06/2021   PHOS 3.5 04/16/2020   PROT 7.1 07/06/2021   ALBUMIN 4.7 07/06/2021   LABGLOB 2.4 07/06/2021   AGRATIO 2.0 07/06/2021   BILITOT <0.2 07/06/2021   ALKPHOS 76 07/06/2021   AST 24 07/06/2021   ALT 23 07/06/2021   ANIONGAP 13 04/16/2020   Last lipids Lab Results  Component Value Date   CHOL 136 07/06/2021   HDL 39 (L) 07/06/2021   LDLCALC 68 07/06/2021   TRIG 174 (H) 07/06/2021   CHOLHDL 3.5 07/06/2021   Last hemoglobin A1c Lab Results  Component Value Date   HGBA1C 5.7 (H) 07/06/2021   Last thyroid functions Lab Results  Component Value Date   TSH 1.980 07/06/2021   Last vitamin D No results found for: "25OHVITD2", "25OHVITD3", "VD25OH" Last vitamin B12 and Folate No results found for: "VITAMINB12", "FOLATE"    The ASCVD Risk score (Arnett DK, et al., 2019) failed to calculate for the following reasons:   The 2019 ASCVD risk score  is only valid for ages 47 to 20    Assessment & Plan:  1. Prediabetes -He will follow a low carbohydrate/non-concentrated sweet diet and exercise as tolerated. - HgB A1c  2. Health care maintenance - He will continue to follow a healthy lifestyle and routine labs will be checked. -He was encouraged to recertify at [facility].  -CBC w/Diff - Comp Met (CMET) - Lipid panel; Future   Follow up in clinic in 3 months or if symptoms worsen.    Sharon Mt, FNP student

## 2022-09-13 ENCOUNTER — Other Ambulatory Visit: Payer: Self-pay

## 2022-09-19 ENCOUNTER — Other Ambulatory Visit: Payer: Self-pay

## 2022-09-20 ENCOUNTER — Ambulatory Visit: Payer: Self-pay | Admitting: Gerontology

## 2023-06-06 ENCOUNTER — Telehealth: Payer: Self-pay

## 2023-06-06 NOTE — Telephone Encounter (Signed)
 Called pt per Nellie Banas to make appt. Pt has not been seen in clinic for over a year. Number is unavailable.
